# Patient Record
Sex: Female | Born: 1975 | Race: White | Hispanic: No | Marital: Married | Smoking: Current some day smoker
Health system: Southern US, Community
[De-identification: ages and names within clinical notes are randomized; demographics above are authoritative.]

## PROBLEM LIST (undated history)

## (undated) DIAGNOSIS — F909 Attention-deficit hyperactivity disorder, unspecified type: Secondary | ICD-10-CM

## (undated) DIAGNOSIS — Z975 Presence of (intrauterine) contraceptive device: Secondary | ICD-10-CM

## (undated) DIAGNOSIS — K219 Gastro-esophageal reflux disease without esophagitis: Secondary | ICD-10-CM

## (undated) DIAGNOSIS — M48061 Spinal stenosis, lumbar region without neurogenic claudication: Secondary | ICD-10-CM

## (undated) DIAGNOSIS — N809 Endometriosis, unspecified: Secondary | ICD-10-CM

## (undated) HISTORY — PX: OTHER SURGICAL HISTORY: SHX169

## (undated) HISTORY — PX: CHOLECYSTECTOMY: SHX55

## (undated) HISTORY — DX: Spinal stenosis, lumbar region without neurogenic claudication: M48.061

## (undated) HISTORY — DX: Attention-deficit hyperactivity disorder, unspecified type: F90.9

## (undated) HISTORY — DX: Gastro-esophageal reflux disease without esophagitis: K21.9

## (undated) HISTORY — DX: Presence of (intrauterine) contraceptive device: Z97.5

## (undated) HISTORY — PX: WISDOM TOOTH EXTRACTION: SHX21

---
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
## 2003-01-20 ENCOUNTER — Emergency Department (HOSPITAL_COMMUNITY): Admission: EM | Admit: 2003-01-20 | Discharge: 2003-01-20 | Payer: Self-pay | Admitting: Emergency Medicine

## 2005-02-28 LAB — HM COLONOSCOPY

## 2007-07-26 ENCOUNTER — Other Ambulatory Visit: Admission: RE | Admit: 2007-07-26 | Discharge: 2007-07-26 | Payer: Self-pay | Admitting: Gynecology

## 2009-02-12 LAB — LIPID PANEL: Cholesterol: 183 mg/dL (ref 0–200)

## 2010-07-29 ENCOUNTER — Other Ambulatory Visit: Payer: Self-pay | Admitting: Emergency Medicine

## 2010-07-29 ENCOUNTER — Inpatient Hospital Stay (INDEPENDENT_AMBULATORY_CARE_PROVIDER_SITE_OTHER)
Admission: RE | Admit: 2010-07-29 | Discharge: 2010-07-29 | Disposition: A | Discharge status: Home / Self Care | Payer: BC Managed Care – PPO | Source: Ambulatory Visit | Attending: Emergency Medicine | Admitting: Emergency Medicine

## 2010-07-29 ENCOUNTER — Ambulatory Visit
Admission: RE | Admit: 2010-07-29 | Discharge: 2010-07-29 | Disposition: A | Discharge status: Home / Self Care | Payer: BC Managed Care – PPO | Source: Ambulatory Visit | Attending: Emergency Medicine | Admitting: Emergency Medicine

## 2010-07-29 ENCOUNTER — Encounter: Payer: Self-pay | Admitting: Emergency Medicine

## 2010-07-29 DIAGNOSIS — M25569 Pain in unspecified knee: Secondary | ICD-10-CM | POA: Insufficient documentation

## 2010-07-30 ENCOUNTER — Ambulatory Visit (INDEPENDENT_AMBULATORY_CARE_PROVIDER_SITE_OTHER): Payer: BC Managed Care – PPO | Admitting: Family Medicine

## 2010-07-30 ENCOUNTER — Encounter: Payer: Self-pay | Admitting: Family Medicine

## 2010-07-30 VITALS — BP 116/79 | HR 102 | Temp 99.2°F | Ht 65.0 in | Wt 207.0 lb

## 2010-07-30 DIAGNOSIS — M25562 Pain in left knee: Secondary | ICD-10-CM | POA: Insufficient documentation

## 2010-07-30 DIAGNOSIS — M25569 Pain in unspecified knee: Secondary | ICD-10-CM

## 2010-07-30 NOTE — Patient Instructions (Signed)
Your exam is consistent with a lateral meniscal tear of your left knee. We will proceed with an MRI to further assess - if this is present, given how bad your pain is, you'll likely need arthroscopic surgery. Ice 15 minutes at a time 3-4 times a day. Take aleve 2 tabs twice a day with food for pain and inflammation (take your PPI with this). Tylenol ok to take in addition to this. ACE wrap or knee brace to help with support in meantime. Crutches as needed. I will call you with the MRI results and how to proceed.

## 2010-07-30 NOTE — Progress Notes (Signed)
  Subjective:    Patient ID: Rebecca Maxwell, female    DOB: 26-May-1975, 35 y.o.   MRN: [id number]  HPI  PCP: [place]  35 yo F here for left knee injury.  Patient reports on 6/13 she was outside on deck standing, holding door open. Her right foot slipped forward and she twisted/turned her left knee inward and felt a pop lateral left knee. + swelling but no bruising. Swelling has improved since yesterday. Unable to walk since injury - cannot fully bend knee without pain laterally. Feels unstable and weak like it's going to give out. Has been icing but not taking medicines. No true catching or locking but not moving knee well. No prior injuries/surgeries left knee.  History reviewed. No pertinent past medical history.  No current outpatient prescriptions on file prior to visit.    No past surgical history on file.  Allergies  Allergen Reactions  . Penicillins     History   Social History  . Marital Status: Married    Spouse Name: N/A    Number of Children: N/A  . Years of Education: N/A   Occupational History  . Not on file.   Social History Main Topics  . Smoking status: Current Everyday Smoker    Types: Cigarettes  . Smokeless tobacco: Not on file   Comment: 1/2 pack every 3 days  . Alcohol Use: Not on file  . Drug Use: Not on file  . Sexually Active: Not on file   Other Topics Concern  . Not on file   Social History Narrative  . No narrative on file    Family History  Problem Relation Age of Onset  . Diabetes Mother   . Diabetes Sister   . Heart attack Neg Hx   . Hypertension Neg Hx     BP 116/79  Pulse 102  Temp(Src) 99.2 F (37.3 C) (Oral)  Ht 5\' 5"  (1.651 m)  Wt 207 lb (93.895 kg)  BMI 34.45 kg/m2  Review of Systems See HPI above.    Objective:   Physical Exam Gen: NAD  L knee: Mild effusion.  No deformity, bruising, erythema, warmth. TTP lateral joint line.  No posterior patella, medial joint line, patellar tendon, or  other TTP. Full extension but only flexes to 90 degrees - severe pain laterally at this extent of flexion. Stable to valgus and varus stress without pain.  Negative ant/post drawers.  Negative lachmanns Mcmurrays and apleys + laterally.  Negative medially. Negative apprehension NVI distally.  R knee: FROM without pain, swelling, instability.     Assessment & Plan:  1. L knee pain - history and exam consistent with lateral meniscus tear.  + mcmurrays and apleys, limited ROM.  Will proceed with MRI to assess.  Given her limited motion, severe pain, likely she will need arthroscopy if tear confirmed.  Her LCL has a solid end point and no pain with varus stress.  ACL also feels intact on exam.  X-rays reviewed and no fractures or other bony abnormalities.  Will call patient with results and how we will proceed.

## 2010-07-30 NOTE — Assessment & Plan Note (Signed)
history and exam consistent with lateral meniscus tear.  + mcmurrays and apleys, limited ROM.  Will proceed with MRI to assess.  Given her limited motion, severe pain, likely she will need arthroscopy if tear confirmed.  Her LCL has a solid end point and no pain with varus stress.  ACL also feels intact on exam.  X-rays reviewed and no fractures or other bony abnormalities.  Will call patient with results and how we will proceed.

## 2010-07-31 ENCOUNTER — Other Ambulatory Visit: Payer: Self-pay | Admitting: Family Medicine

## 2010-07-31 DIAGNOSIS — M25569 Pain in unspecified knee: Secondary | ICD-10-CM

## 2010-07-31 MED ORDER — OXYCODONE-ACETAMINOPHEN 5-325 MG PO TABS: 1.0000 | ORAL_TABLET | Freq: Four times a day (QID) | ORAL | Status: AC | PRN

## 2010-08-01 ENCOUNTER — Ambulatory Visit (HOSPITAL_BASED_OUTPATIENT_CLINIC_OR_DEPARTMENT_OTHER)
Admission: RE | Admit: 2010-08-01 | Discharge: 2010-08-01 | Disposition: A | Discharge status: Home / Self Care | Payer: BC Managed Care – PPO | Source: Ambulatory Visit | Attending: Family Medicine | Admitting: Family Medicine

## 2010-08-01 ENCOUNTER — Ambulatory Visit (HOSPITAL_BASED_OUTPATIENT_CLINIC_OR_DEPARTMENT_OTHER): Payer: BC Managed Care – PPO

## 2010-08-01 DIAGNOSIS — M7120 Synovial cyst of popliteal space [Baker], unspecified knee: Secondary | ICD-10-CM | POA: Insufficient documentation

## 2010-08-01 DIAGNOSIS — M2240 Chondromalacia patellae, unspecified knee: Secondary | ICD-10-CM | POA: Insufficient documentation

## 2010-08-01 DIAGNOSIS — M25569 Pain in unspecified knee: Secondary | ICD-10-CM | POA: Insufficient documentation

## 2010-08-01 DIAGNOSIS — X58XXXA Exposure to other specified factors, initial encounter: Secondary | ICD-10-CM | POA: Insufficient documentation

## 2010-08-01 DIAGNOSIS — S83419A Sprain of medial collateral ligament of unspecified knee, initial encounter: Secondary | ICD-10-CM

## 2010-08-01 DIAGNOSIS — M25562 Pain in left knee: Secondary | ICD-10-CM

## 2010-08-01 DIAGNOSIS — M25469 Effusion, unspecified knee: Secondary | ICD-10-CM | POA: Insufficient documentation

## 2010-08-01 DIAGNOSIS — S82109A Unspecified fracture of upper end of unspecified tibia, initial encounter for closed fracture: Secondary | ICD-10-CM

## 2010-08-04 ENCOUNTER — Telehealth: Payer: Self-pay | Admitting: Family Medicine

## 2010-08-04 DIAGNOSIS — M25562 Pain in left knee: Secondary | ICD-10-CM

## 2010-08-04 NOTE — Assessment & Plan Note (Signed)
Spoke with patient regarding MRI results - nondisplaced lateral tibial plateau fracture, mild LCL sprain but no other ligament, meniscus tears.  Advised patient to be only touch-down weightbearing for first 4 weeks with crutches OR can use knee scooter.  She reported she was feeling better and was resistant to using crutches or scooter.  Advised her that without doing so, she is at a greater risk of depression and/or displacement of tibial plateau fracture which may require surgery if this happens.  She states she understands these risks.  She will f/u in 2 weeks for repeat knee x-rays with obliques to reassess.

## 2010-08-04 NOTE — Telephone Encounter (Signed)
Spoke with patient regarding MRI results - nondisplaced lateral tibial plateau fracture, mild LCL sprain but no other ligament, meniscus tears.  Advised patient to be only touch-down weightbearing for first 4 weeks with crutches OR can use knee scooter.  She reported she was feeling better and was resistant to using crutches or scooter.  Advised her that without doing so, she is at a greater risk of depression and/or displacement of tibial plateau fracture which may require surgery if this happens.  She states she understands these risks.  She will f/u in 2 weeks for repeat knee x-rays with obliques to reassess. 

## 2010-08-18 ENCOUNTER — Ambulatory Visit (INDEPENDENT_AMBULATORY_CARE_PROVIDER_SITE_OTHER): Payer: BC Managed Care – PPO | Admitting: Family Medicine

## 2010-08-18 VITALS — BP 106/73 | HR 88 | Temp 98.2°F | Ht 65.0 in | Wt 205.0 lb

## 2010-08-18 DIAGNOSIS — M25562 Pain in left knee: Secondary | ICD-10-CM

## 2010-08-18 DIAGNOSIS — M25569 Pain in unspecified knee: Secondary | ICD-10-CM

## 2010-08-20 ENCOUNTER — Encounter: Payer: Self-pay | Admitting: Family Medicine

## 2010-08-20 NOTE — Progress Notes (Signed)
Subjective:    Patient ID: Rebecca Maxwell, female    DOB: 06-Apr-1975, 35 y.o.   MRN: [id number]  Knee Pain     PCP: [place]  35 yo F here for f/u left knee injury.  Initial OV 6/14: Patient reports on 6/13 she was outside on deck standing, holding door open. Her right foot slipped forward and she twisted/turned her left knee inward and felt a pop lateral left knee. + swelling but no bruising. Swelling has improved since yesterday. Unable to walk since injury - cannot fully bend knee without pain laterally. Feels unstable and weak like it's going to give out. Has been icing but not taking medicines. No true catching or locking but not moving knee well. No prior injuries/surgeries left knee.  Today 7/3: Patient was advised to use crutches, avoid sports activities given MRI showed probable hairline lateral tibial plateau fracture and mild LCL sprain. Patient reports pain had completely resolved about 3 days after her MRI. Has not been using crutches but has not gone to gym. Not requiring pain medicine or ice.  History reviewed. No pertinent past medical history.  Current Outpatient Prescriptions on File Prior to Visit  Medication Sig Dispense Refill  . oxyCODONE-acetaminophen (ROXICET) 5-325 MG per tablet Take 1 tablet by mouth every 6 (six) hours as needed for pain.  40 tablet  0  . pantoprazole (PROTONIX) 40 MG tablet         No past surgical history on file.  Allergies  Allergen Reactions  . Penicillins     History   Social History  . Marital Status: Married    Spouse Name: N/A    Number of Children: N/A  . Years of Education: N/A   Occupational History  . Not on file.   Social History Main Topics  . Smoking status: Current Everyday Smoker    Types: Cigarettes  . Smokeless tobacco: Not on file   Comment: 1/2 pack every 3 days  . Alcohol Use: Not on file  . Drug Use: Not on file  . Sexually Active: Not on file   Other Topics Concern  . Not  on file   Social History Narrative  . No narrative on file    Family History  Problem Relation Age of Onset  . Diabetes Mother   . Diabetes Sister   . Heart attack Neg Hx   . Hypertension Neg Hx     There were no vitals taken for this visit.  Review of Systems  See HPI above.    Objective:   Physical Exam  Gen: NAD  L knee: No effusion.  No deformity, bruising, erythema, warmth. No TTP lateral joint line.  No posterior patella, medial joint line, patellar tendon, or other TTP. FROM without pain. Stable to valgus and varus stress without pain.  Negative ant/post drawers.  Negative lachmanns Mcmurrays and apleys negative.  Negative medially. Negative apprehension NVI distally.  R knee: FROM without pain, swelling, instability.     Assessment & Plan:  1. L knee pain - Clinically she is completely improved and has been since approximately 9 days out from her injury - not consistent with a tibial plateau fracture where would expect 4-6 weeks before she is clinically healed - likely overread on MRI and had a contusion.  LCL stable and no pain to testing.  Advised her it is ok to go back to regular activities given her exam today.  Without tenderness, completely normal exam, do not feel we need  to repeat knee radiographs.  Slowly work her way back into gym/sports activities - start at 50% time and effort (zumba), no deep squats and lunges, minimize twisting.  As long as no pain or only 1-2/10 on pain scale, ok to continue.  If worsens, to call me and stop activities.

## 2010-08-20 NOTE — Assessment & Plan Note (Signed)
Clinically she is completely improved and has been since approximately 9 days out from her injury - not consistent with a tibial plateau fracture where would expect 4-6 weeks before she is clinically healed - likely overread on MRI and had a contusion.  LCL stable and no pain to testing.  Advised her it is ok to go back to regular activities given her exam today.  Without tenderness, completely normal exam, do not feel we need to repeat knee radiographs.  Slowly work her way back into gym/sports activities - start at 50% time and effort (zumba), no deep squats and lunges, minimize twisting.  As long as no pain or only 1-2/10 on pain scale, ok to continue.  If worsens, to call me and stop activities.

## 2011-01-18 NOTE — Progress Notes (Signed)
Summary: KNEE PAIN   Vital Signs:  Patient Profile:   35 Years Old Female CC:      left knee pain x today post fall Height:     65 inches Weight:      209 pounds O2 Sat:      96 % O2 treatment:    Room Air Temp:     99.2 degrees F oral Pulse rate:   85 / minute Resp:     16 per minute BP sitting:   112 / 71  (left arm) Cuff size:   regular  Pt. in pain?   yes    Location:   left knee  Vitals Entered By: Lajean Saver RN (July 29, 2010 3:38 PM)                   Updated Prior Medication List: DEPO-PROVERA 150 MG/ML SUSP (MEDROXYPROGESTERONE ACETATE)   Current Allergies: ! PCNHistory of Present Illness History from: patient Chief Complaint: left knee pain x today post fall History of Present Illness: L knee pain since this morning.  She was walking on her deck this morning, slipped and caught herself.  She felt her L knee bend inward and heard a pop on the lateral aspect.  Since then has had pain on the outside of her L knee.  No more popping, locking, giving way.  No swelling.  Not using any meds.  REVIEW OF SYSTEMS Constitutional Symptoms      Denies fever, chills, night sweats, weight loss, weight gain, and fatigue.  Eyes       Denies change in vision, eye pain, eye discharge, glasses, contact lenses, and eye surgery. Ear/Nose/Throat/Mouth       Denies hearing loss/aids, change in hearing, ear pain, ear discharge, dizziness, frequent runny nose, frequent nose bleeds, sinus problems, sore throat, hoarseness, and tooth pain or bleeding.  Respiratory       Denies dry cough, productive cough, wheezing, shortness of breath, asthma, bronchitis, and emphysema/COPD.  Cardiovascular       Denies murmurs, chest pain, and tires easily with exhertion.    Gastrointestinal       Denies stomach pain, nausea/vomiting, diarrhea, constipation, blood in bowel movements, and indigestion. Genitourniary       Denies painful urination, blood or discharge from vagina, kidney stones, and  loss of urinary control. Neurological       Denies paralysis, seizures, and fainting/blackouts. Musculoskeletal       Complains of joint pain.      Denies muscle pain, joint stiffness, decreased range of motion, redness, swelling, muscle weakness, and gout.      Comments: left knee Skin       Denies bruising, unusual mles/lumps or sores, and hair/skin or nail changes.  Psych       Denies mood changes, temper/anger issues, anxiety/stress, speech problems, depression, and sleep problems. Other Comments: Patient fell while on her deck this AM   Past History:  Past Medical History: Unremarkable  Past Surgical History: bilateral ankles-reconstruction  Family History: Family History Diabetes 1st degree relative- mother and sisters  Social History: Nursing Student Married Current Smoker Alcohol use-no Drug use-no Smoking Status:  current Drug Use:  no Physical Exam General appearance: well developed, well nourished, mild distress Skin: no obvious rashes or lesions MSE: oriented to time, place, and person L knee: PROM normal, AROM limited by pain at 45 degrees flexion, no effusion, no ecchymoses, Anterior & posterior drawer normal, McMurrays + for pain, Varus causes mild  pain & valgus stress normal.  Patella freely mobile, Clarks compression test normal.  Good alignment. Distal NV status intact. Assessment New Problems: FAMILY HISTORY DIABETES 1ST DEGREE RELATIVE (ICD-V18.0) KNEE PAIN (ICD-719.46)   Plan New Orders: New Patient Level III [99203] T-DG Knee Complete 4 Views*L* [16109] Planning Comments:   Encourage ice, rest, elevation.  She cannot take NSAIDs so nothing is Rx.  Xray ordered and read as normal.  DDx  includes lateral meniscus, LCL, hamstring tendonopathy.  I would like her to see Dr. 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  Advised her of possible need for brace, PT vs advanced imaging.   The  patient and/or caregiver has been counseled thoroughly with regard to medications prescribed including dosage, schedule, interactions, rationale for use, and possible side effects and they verbalize understanding.  Diagnoses and expected course of recovery discussed and will return if not improved as expected or if the condition worsens. Patient and/or caregiver verbalized understanding.   Orders Added: 1)  New Patient Level III [99203] 2)  T-DG Knee Complete 4 Views*L* [60454]

## 2011-11-29 LAB — HM PAP SMEAR: HM Pap smear: NORMAL

## 2012-03-07 ENCOUNTER — Other Ambulatory Visit: Payer: Self-pay | Admitting: Obstetrics & Gynecology

## 2012-03-08 LAB — TSH: TSH: 1.083 u[IU]/mL (ref 0.350–4.500)

## 2012-03-09 LAB — T3, FREE: T3, Free: 3.2 pg/mL (ref 2.3–4.2)

## 2012-03-09 LAB — T4, FREE: Free T4: 1.41 ng/dL (ref 0.80–1.80)

## 2012-03-13 ENCOUNTER — Telehealth: Payer: Self-pay | Admitting: *Deleted

## 2012-03-13 NOTE — Telephone Encounter (Signed)
Pt notified of normal T3 and T4.

## 2012-03-31 ENCOUNTER — Ambulatory Visit (INDEPENDENT_AMBULATORY_CARE_PROVIDER_SITE_OTHER): Payer: BC Managed Care – PPO | Admitting: Sports Medicine

## 2012-03-31 ENCOUNTER — Encounter: Payer: Self-pay | Admitting: Sports Medicine

## 2012-03-31 VITALS — BP 128/77 | HR 116 | Resp 18 | Ht 64.5 in | Wt 192.0 lb

## 2012-03-31 DIAGNOSIS — F909 Attention-deficit hyperactivity disorder, unspecified type: Secondary | ICD-10-CM

## 2012-03-31 DIAGNOSIS — K219 Gastro-esophageal reflux disease without esophagitis: Secondary | ICD-10-CM

## 2012-03-31 DIAGNOSIS — Z299 Encounter for prophylactic measures, unspecified: Secondary | ICD-10-CM | POA: Insufficient documentation

## 2012-03-31 NOTE — Progress Notes (Signed)
Subjective:    CC: Establish care.   HPI:  Acid reflux: Stable on protonix.  ADHD: Stable on current medication.  Preventative measures: Pap smears and Depo-Provera shots are done by her OB/GYN. Has already had thyroid function checked and we'll get the results of this. She does need some other blood work done.  Past medical history, Surgical history, Family history not pertinant except as noted below, Social history, Allergies, and medications have been entered into the medical record, reviewed, and no changes needed.   Review of Systems: No headache, visual changes, nausea, vomiting, diarrhea, constipation, dizziness, abdominal pain, skin rash, fevers, chills, night sweats, swollen lymph nodes, weight loss, chest pain, body aches, joint swelling, muscle aches, shortness of breath, mood changes, visual or auditory hallucinations.  Objective:    General: Well Developed, well nourished, and in no acute distress.  Neuro: Alert and oriented x3, extra-ocular muscles intact, sensation grossly intact.  HEENT: Normocephalic, atraumatic, pupils equal round reactive to light, neck supple, no masses, no lymphadenopathy, thyroid nonpalpable.  Skin: Warm and dry, no rashes noted.  Cardiac: Regular rate and rhythm, no murmurs rubs or gallops.  Respiratory: Clear to auscultation bilaterally. Not using accessory muscles, speaking in full sentences.  Abdominal: Soft, nontender, nondistended, positive bowel sounds, no masses, no organomegaly.  Musculoskeletal: Shoulder, elbow, wrist, hip, knee, ankle stable, and with full range of motion. Impression and Recommendations:   The patient was counselled, risk factors were discussed, anticipatory guidance given.

## 2012-03-31 NOTE — Assessment & Plan Note (Signed)
Stable, no changes  

## 2012-03-31 NOTE — Assessment & Plan Note (Signed)
Checking some routine blood work. She will get a hold of her thyroid studies. Up-to-date on vaccinations.

## 2012-04-03 ENCOUNTER — Encounter: Payer: Self-pay | Admitting: Sports Medicine

## 2012-04-03 NOTE — Progress Notes (Signed)
Patient ID: Rebecca Maxwell, female   DOB: 11/01/1975, 37 y.o.   MRN: [id number] Records reviewed.

## 2012-04-07 LAB — COMPREHENSIVE METABOLIC PANEL
ALT: 12 U/L (ref 0–35)
AST: 14 U/L (ref 0–37)
Albumin: 4.3 g/dL (ref 3.5–5.2)
Alkaline Phosphatase: 59 U/L (ref 39–117)
BUN: 8 mg/dL (ref 6–23)
CO2: 23 mEq/L (ref 19–32)
Calcium: 9.6 mg/dL (ref 8.4–10.5)
Chloride: 108 mEq/L (ref 96–112)
Creat: 0.69 mg/dL (ref 0.50–1.10)
Glucose, Bld: 84 mg/dL (ref 70–99)
Potassium: 4.2 mEq/L (ref 3.5–5.3)
Sodium: 140 mEq/L (ref 135–145)
Total Bilirubin: 0.4 mg/dL (ref 0.3–1.2)
Total Protein: 7.2 g/dL (ref 6.0–8.3)

## 2012-04-07 LAB — LIPID PANEL
Cholesterol: 164 mg/dL (ref 0–200)
HDL: 50 mg/dL (ref >39)
LDL Cholesterol: 101 mg/dL — ABNORMAL HIGH (ref 0–99)
Total CHOL/HDL Ratio: 3.3 Ratio
Triglycerides: 63 mg/dL (ref <150)
VLDL: 13 mg/dL (ref 0–40)

## 2012-04-08 LAB — HEMOGLOBIN A1C

## 2012-04-08 LAB — VITAMIN D 25 HYDROXY (VIT D DEFICIENCY, FRACTURES): Vit D, 25-Hydroxy: 26 ng/mL — ABNORMAL LOW (ref 30–89)

## 2012-04-09 MED ORDER — VITAMIN D (ERGOCALCIFEROL) 1.25 MG (50000 UNIT) PO CAPS: 50000.0000 [IU] | ORAL_CAPSULE | ORAL | Status: DC

## 2012-04-09 NOTE — Addendum Note (Signed)
Addended by: Monica Becton on: 04/09/2012 06:39 PM   Modules accepted: Orders

## 2012-04-10 ENCOUNTER — Telehealth: Payer: Self-pay | Admitting: *Deleted

## 2012-04-10 MED ORDER — AMPHETAMINE-DEXTROAMPHETAMINE 15 MG PO TABS: 15.0000 mg | ORAL_TABLET | Freq: Two times a day (BID) | ORAL | Status: DC

## 2012-04-10 NOTE — Telephone Encounter (Signed)
Pt has called needing the refill on her Adderal. I wanted to make sure it was ok to fill since you have received her records before I print it out.

## 2012-04-10 NOTE — Telephone Encounter (Signed)
Rx in my outbox

## 2012-04-11 ENCOUNTER — Telehealth: Payer: Self-pay | Admitting: *Deleted

## 2012-04-11 NOTE — Telephone Encounter (Signed)
Eh...ok can cancel it.

## 2012-04-11 NOTE — Telephone Encounter (Signed)
[facility] calls and states that they did not receive a lavender top tube so can not run the A1C order.

## 2012-04-12 ENCOUNTER — Other Ambulatory Visit: Payer: Self-pay

## 2012-04-12 MED ORDER — VITAMIN D (ERGOCALCIFEROL) 1.25 MG (50000 UNIT) PO CAPS: 50000.0000 [IU] | ORAL_CAPSULE | ORAL | Status: DC

## 2012-04-13 ENCOUNTER — Encounter: Payer: Self-pay | Admitting: Sports Medicine

## 2012-04-24 ENCOUNTER — Encounter: Payer: Self-pay | Admitting: Sports Medicine

## 2012-05-02 ENCOUNTER — Ambulatory Visit (INDEPENDENT_AMBULATORY_CARE_PROVIDER_SITE_OTHER): Payer: BC Managed Care – PPO | Admitting: Sports Medicine

## 2012-05-02 DIAGNOSIS — R7309 Other abnormal glucose: Secondary | ICD-10-CM

## 2012-05-02 LAB — POCT GLYCOSYLATED HEMOGLOBIN (HGB A1C): Hemoglobin A1C: 5.3

## 2012-05-02 NOTE — Progress Notes (Signed)
  Subjective:    Patient ID: Rebecca Maxwell, female    DOB: 10-Nov-1975, 37 y.o.   MRN: [id number] A1c is 5.3 today. HPI    Review of Systems     Objective:   Physical Exam        Assessment & Plan:  I was present for all essential parts of this visit and procedure.  Ihor Austin. Benjamin Stain, M.D.

## 2012-05-09 ENCOUNTER — Encounter: Payer: Self-pay | Admitting: Sports Medicine

## 2012-05-09 NOTE — Progress Notes (Signed)
Records reviewed.

## 2012-05-15 ENCOUNTER — Other Ambulatory Visit: Payer: Self-pay | Admitting: *Deleted

## 2012-05-15 MED ORDER — AMPHETAMINE-DEXTROAMPHETAMINE 15 MG PO TABS: 15.0000 mg | ORAL_TABLET | Freq: Two times a day (BID) | ORAL | Status: DC

## 2012-05-30 ENCOUNTER — Other Ambulatory Visit: Payer: Self-pay | Admitting: Obstetrics & Gynecology

## 2012-06-01 LAB — URINE CULTURE
Colony Count: NO GROWTH
Organism ID, Bacteria: NO GROWTH

## 2012-06-01 LAB — URINALYSIS W MICROSCOPIC + REFLEX CULTURE

## 2012-06-12 ENCOUNTER — Other Ambulatory Visit: Payer: Self-pay | Admitting: Sports Medicine

## 2012-06-12 MED ORDER — AMPHETAMINE-DEXTROAMPHETAMINE 15 MG PO TABS: 15.0000 mg | ORAL_TABLET | Freq: Two times a day (BID) | ORAL | Status: DC

## 2012-07-17 ENCOUNTER — Other Ambulatory Visit: Payer: Self-pay | Admitting: *Deleted

## 2012-07-17 MED ORDER — AMPHETAMINE-DEXTROAMPHETAMINE 15 MG PO TABS: 15.0000 mg | ORAL_TABLET | Freq: Two times a day (BID) | ORAL | Status: DC

## 2012-08-25 ENCOUNTER — Encounter: Payer: Self-pay | Admitting: Sports Medicine

## 2012-08-28 ENCOUNTER — Telehealth: Payer: Self-pay

## 2012-08-28 ENCOUNTER — Other Ambulatory Visit: Payer: Self-pay | Admitting: Sports Medicine

## 2012-08-28 DIAGNOSIS — Z01818 Encounter for other preprocedural examination: Secondary | ICD-10-CM

## 2012-08-28 MED ORDER — AMPHETAMINE-DEXTROAMPHETAMINE 15 MG PO TABS: 15.0000 mg | ORAL_TABLET | Freq: Two times a day (BID) | ORAL | Status: DC

## 2012-09-04 ENCOUNTER — Encounter: Payer: Self-pay | Admitting: Sports Medicine

## 2012-09-04 LAB — CBC
HCT: 40.2 % (ref 36.0–46.0)
Hemoglobin: 13.8 g/dL (ref 12.0–15.0)
MCH: 31.6 pg (ref 26.0–34.0)
MCHC: 34.3 g/dL (ref 30.0–36.0)
MCV: 92 fL (ref 78.0–100.0)
Platelets: 288 10*3/uL (ref 150–400)
RBC: 4.37 MIL/uL (ref 3.87–5.11)
RDW: 13.5 % (ref 11.5–15.5)
WBC: 8.3 10*3/uL (ref 4.0–10.5)

## 2012-09-18 DIAGNOSIS — Z720 Tobacco use: Secondary | ICD-10-CM | POA: Insufficient documentation

## 2012-10-03 ENCOUNTER — Other Ambulatory Visit: Payer: Self-pay | Admitting: Sports Medicine

## 2012-10-03 MED ORDER — AMPHETAMINE-DEXTROAMPHETAMINE 15 MG PO TABS: 15.0000 mg | ORAL_TABLET | Freq: Two times a day (BID) | ORAL | Status: DC

## 2012-10-17 ENCOUNTER — Other Ambulatory Visit: Payer: Self-pay | Admitting: Obstetrics & Gynecology

## 2012-10-18 ENCOUNTER — Other Ambulatory Visit: Payer: Self-pay | Admitting: Obstetrics & Gynecology

## 2012-10-18 DIAGNOSIS — N390 Urinary tract infection, site not specified: Secondary | ICD-10-CM

## 2012-10-18 MED ORDER — PHENAZOPYRIDINE HCL 200 MG PO TABS: 200.0000 mg | ORAL_TABLET | Freq: Three times a day (TID) | ORAL | Status: DC | PRN

## 2012-10-18 MED ORDER — CIPROFLOXACIN HCL 500 MG PO TABS: 500.0000 mg | ORAL_TABLET | Freq: Two times a day (BID) | ORAL | Status: DC

## 2012-10-19 ENCOUNTER — Encounter: Payer: Self-pay | Admitting: Sports Medicine

## 2012-10-21 LAB — URINE CULTURE: Colony Count: 25000

## 2012-11-17 ENCOUNTER — Encounter: Payer: Self-pay | Admitting: Sports Medicine

## 2012-11-17 ENCOUNTER — Other Ambulatory Visit: Payer: Self-pay | Admitting: *Deleted

## 2012-11-17 MED ORDER — AMPHETAMINE-DEXTROAMPHETAMINE 15 MG PO TABS: 15.0000 mg | ORAL_TABLET | Freq: Two times a day (BID) | ORAL | Status: DC

## 2012-12-09 ENCOUNTER — Emergency Department (HOSPITAL_BASED_OUTPATIENT_CLINIC_OR_DEPARTMENT_OTHER): Payer: BC Managed Care – PPO

## 2012-12-09 ENCOUNTER — Encounter (HOSPITAL_BASED_OUTPATIENT_CLINIC_OR_DEPARTMENT_OTHER): Payer: Self-pay | Admitting: Emergency Medicine

## 2012-12-09 ENCOUNTER — Emergency Department (HOSPITAL_BASED_OUTPATIENT_CLINIC_OR_DEPARTMENT_OTHER)
Admission: EM | Admit: 2012-12-09 | Discharge: 2012-12-10 | Disposition: A | Discharge status: Home / Self Care | Payer: BC Managed Care – PPO | Attending: Emergency Medicine | Admitting: Emergency Medicine

## 2012-12-09 DIAGNOSIS — Z87891 Personal history of nicotine dependence: Secondary | ICD-10-CM | POA: Insufficient documentation

## 2012-12-09 DIAGNOSIS — Z79899 Other long term (current) drug therapy: Secondary | ICD-10-CM | POA: Insufficient documentation

## 2012-12-09 DIAGNOSIS — T148XXA Other injury of unspecified body region, initial encounter: Secondary | ICD-10-CM

## 2012-12-09 DIAGNOSIS — Z792 Long term (current) use of antibiotics: Secondary | ICD-10-CM | POA: Insufficient documentation

## 2012-12-09 DIAGNOSIS — Z8659 Personal history of other mental and behavioral disorders: Secondary | ICD-10-CM | POA: Insufficient documentation

## 2012-12-09 DIAGNOSIS — Z880 Allergy status to penicillin: Secondary | ICD-10-CM | POA: Insufficient documentation

## 2012-12-09 DIAGNOSIS — R233 Spontaneous ecchymoses: Secondary | ICD-10-CM | POA: Insufficient documentation

## 2012-12-09 DIAGNOSIS — K219 Gastro-esophageal reflux disease without esophagitis: Secondary | ICD-10-CM | POA: Insufficient documentation

## 2012-12-09 HISTORY — DX: Endometriosis, unspecified: N80.9

## 2012-12-09 NOTE — ED Provider Notes (Signed)
CSN: [id number]     Arrival date & time 12/09/12  2140 History  This chart was scribed for Rebecca Linan Smitty Cords, MD by Danella Maiers, ED Scribe. This patient was seen in room MH03/MH03 and the patient's care was started at 11:51 PM.   Chief Complaint  Patient presents with  . Ankle Pain    with deformity   Patient is a 37 y.o. female presenting with ankle pain. The history is provided by the patient. No language interpreter was used.  Ankle Pain Location:  Foot Time since incident:  1 day Injury: no   Foot location:  L foot Pain details:    Quality:  Aching   Radiates to:  Does not radiate   Severity:  Moderate   Onset quality:  Sudden   Timing:  Constant   Progression:  Unchanged Chronicity:  New Dislocation: no   Foreign body present:  No foreign bodies Relieved by:  Nothing Worsened by:  Nothing tried Ineffective treatments:  Ice Associated symptoms: swelling   Associated symptoms: no back pain   Risk factors: no concern for non-accidental trauma    HPI Comments: Rebecca Maxwell is a 37 y.o. female who presents to the Emergency Department complaining of gradual-onset left foot pain with deformity to the dorsal aspect that started 24 hours ago. She denies  falls. Was digging stuff and jamming down on a device.  The pain worsens when the area is touched. She has been trying ibuprofen and ice with no relief.  Past Medical History  Diagnosis Date  . ADHD (attention deficit hyperactivity disorder)   . GERD (gastroesophageal reflux disease)    Past Surgical History  Procedure Laterality Date  . Ankle reconstruction Bilateral 1997  . Cholecystectomy    . Wisdom tooth extraction Bilateral    Family History  Problem Relation Age of Onset  . Diabetes Mother   . Hyperlipidemia Mother   . Hypertension Mother   . Diabetes Sister   . Depression Sister   . Alcohol abuse Father   . Alcohol abuse Maternal Grandmother   . Heart attack Maternal Grandmother   . Stroke  Maternal Grandmother   . Cancer Paternal Grandmother     colon   History  Substance Use Topics  . Smoking status: Former Smoker -- 0.50 packs/day for 10 years    Types: Cigarettes    Quit date: 09/18/2012  . Smokeless tobacco: Never Used     Comment: 1/2 pack every 3 days  . Alcohol Use: No   OB History   Grav Para Term Preterm Abortions TAB SAB Ect Mult Living                 Review of Systems  Musculoskeletal: Positive for arthralgias. Negative for back pain.  All other systems reviewed and are negative.    Allergies  Penicillins  Home Medications   Current Outpatient Rx  Name  Route  Sig  Dispense  Refill  . amphetamine-dextroamphetamine (ADDERALL) 15 MG tablet   Oral   Take 1 tablet (15 mg total) by mouth 2 (two) times daily.   60 tablet   0   . ciprofloxacin (CIPRO) 500 MG tablet   Oral   Take 1 tablet (500 mg total) by mouth 2 (two) times daily.   14 tablet   3   . medroxyPROGESTERone (DEPO-PROVERA) 150 MG/ML injection   Intramuscular   Inject 150 mg into the muscle every 3 (three) months.         Marland Kitchen  pantoprazole (PROTONIX) 40 MG tablet               . phenazopyridine (PYRIDIUM) 200 MG tablet   Oral   Take 1 tablet (200 mg total) by mouth 3 (three) times daily as needed for pain (urethral spasm).   10 tablet   1   . Vitamin D, Ergocalciferol, (DRISDOL) 50000 UNITS CAPS   Oral   Take 1 capsule (50,000 Units total) by mouth every 7 (seven) days.   8 capsule   0    BP 128/77  Pulse 98  Temp(Src) 98.7 F (37.1 C) (Oral)  Resp 18  Ht 5\' 5"  (1.651 m)  Wt 198 lb (89.812 kg)  BMI 32.95 kg/m2  SpO2 98% Physical Exam  Nursing note and vitals reviewed. Constitutional: She is oriented to person, place, and time. She appears well-developed and well-nourished. No distress.  HENT:  Head: Normocephalic and atraumatic.  Mouth/Throat: Oropharynx is clear and moist and mucous membranes are normal.  Eyes: EOM are normal. Pupils are equal, round, and  reactive to light.  Neck: Normal range of motion. Neck supple. No tracheal deviation present.  Cardiovascular: Normal rate and regular rhythm.   Cap refill less than 2 sec.   Pulmonary/Chest: Effort normal and breath sounds normal. No respiratory distress.  Abdominal: Soft. Bowel sounds are normal.  Musculoskeletal: Normal range of motion.  Motor and sensory intact all aspects of the foot.  Hematoma of the dorsum of the foot  Neurological: She is alert and oriented to person, place, and time.  Skin: Skin is warm and dry.  Psychiatric: She has a normal mood and affect. Her behavior is normal.    ED Course  Procedures (including critical care time) Medications - No data to display  DIAGNOSTIC STUDIES: Oxygen Saturation is 98% on RA, normal by my interpretation.    COORDINATION OF CARE: 12:02 AM- Discussed treatment plan with pt. Pt agrees to plan.    Labs Review Labs Reviewed - No data to display Imaging Review Dg Foot Complete Left  12/09/2012   CLINICAL DATA:  Left foot pain. Ankle pain with deformity.  EXAM: LEFT FOOT - COMPLETE 3+ VIEW  COMPARISON:  None.  FINDINGS: Medial malleolar lag screws are present compatible with old ORIF. Calcaneal spurs are present. 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 Deformity of the 5th metatarsal head and neck is present, likely posttraumatic. Soft tissue swelling is present over the dorsum of the midfoot. There is no acute abnormality.  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   Electronically Signed   By: Andreas Newport M.D.   On: 12/09/2012 23:47     EKG Interpretation   None       MDM  No diagnosis found. hematoma    I personally performed the services described in this documentation, which was scribed in my presence. The recorded information has been reviewed and is accurate.     Jasmine Awe, MD 12/10/12 [phone]

## 2012-12-09 NOTE — ED Notes (Signed)
Pt reports gradual onset of pain and deformity to Left foot denies event or injury

## 2012-12-10 MED ORDER — TRAMADOL HCL 50 MG PO TABS
ORAL_TABLET | ORAL | Status: AC
  Administered 2012-12-10: 50 mg via ORAL
  Filled 2012-12-10: qty 1

## 2012-12-10 MED ORDER — TRAMADOL HCL 50 MG PO TABS
50.0000 mg | ORAL_TABLET | Freq: Once | ORAL | Status: AC
  Administered 2012-12-10: 50 mg via ORAL

## 2012-12-10 MED ORDER — MELOXICAM 7.5 MG PO TABS: 7.5000 mg | ORAL_TABLET | Freq: Every day | ORAL | Status: DC

## 2012-12-14 ENCOUNTER — Encounter: Payer: Self-pay | Admitting: *Deleted

## 2012-12-18 ENCOUNTER — Encounter: Payer: Self-pay | Admitting: Sports Medicine

## 2012-12-18 ENCOUNTER — Ambulatory Visit (INDEPENDENT_AMBULATORY_CARE_PROVIDER_SITE_OTHER): Payer: BC Managed Care – PPO | Admitting: Sports Medicine

## 2012-12-18 VITALS — BP 116/71 | HR 90 | Wt 204.0 lb

## 2012-12-18 DIAGNOSIS — M19072 Primary osteoarthritis, left ankle and foot: Secondary | ICD-10-CM

## 2012-12-18 DIAGNOSIS — M19079 Primary osteoarthritis, unspecified ankle and foot: Secondary | ICD-10-CM | POA: Insufficient documentation

## 2012-12-18 MED ORDER — TRAMADOL HCL 50 MG PO TABS: ORAL_TABLET | ORAL | Status: DC
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
## 2012-12-19 ENCOUNTER — Ambulatory Visit: Payer: BC Managed Care – PPO | Admitting: Sports Medicine

## 2012-12-21 ENCOUNTER — Other Ambulatory Visit: Payer: Self-pay

## 2012-12-25 ENCOUNTER — Other Ambulatory Visit: Payer: Self-pay | Admitting: *Deleted

## 2012-12-25 MED ORDER — AMPHETAMINE-DEXTROAMPHETAMINE 15 MG PO TABS: 15.0000 mg | ORAL_TABLET | Freq: Two times a day (BID) | ORAL | Status: DC

## 2012-12-26 ENCOUNTER — Ambulatory Visit (INDEPENDENT_AMBULATORY_CARE_PROVIDER_SITE_OTHER): Payer: BC Managed Care – PPO | Admitting: Nurse Practitioner

## 2012-12-26 ENCOUNTER — Encounter: Payer: Self-pay | Admitting: Nurse Practitioner

## 2012-12-26 VITALS — BP 116/71 | HR 89 | Resp 16 | Ht 65.0 in | Wt 200.0 lb

## 2012-12-26 DIAGNOSIS — G43909 Migraine, unspecified, not intractable, without status migrainosus: Secondary | ICD-10-CM

## 2012-12-26 DIAGNOSIS — G43109 Migraine with aura, not intractable, without status migrainosus: Secondary | ICD-10-CM | POA: Insufficient documentation

## 2012-12-26 MED ORDER — RIZATRIPTAN BENZOATE 10 MG PO TABS: 10.0000 mg | ORAL_TABLET | ORAL | Status: DC | PRN

## 2012-12-26 MED ORDER — ONDANSETRON HCL 8 MG PO TABS: 8.0000 mg | ORAL_TABLET | Freq: Three times a day (TID) | ORAL | Status: DC | PRN

## 2012-12-26 NOTE — Patient Instructions (Signed)
Migraine Headache A migraine headache is an intense, throbbing pain on one or both sides of your head. A migraine can last for 30 minutes to several hours. CAUSES  The exact cause of a migraine headache is not always known. However, a migraine may be caused when nerves in the brain become irritated and release chemicals that cause inflammation. This causes pain. SYMPTOMS  Pain on one or both sides of your head.  Pulsating or throbbing pain.  Severe pain that prevents daily activities.  Pain that is aggravated by any physical activity.  Nausea, vomiting, or both.  Dizziness.  Pain with exposure to bright lights, loud noises, or activity.  General sensitivity to bright lights, loud noises, or smells. Before you get a migraine, you may get warning signs that a migraine is coming (aura). An aura may include:  Seeing flashing lights.  Seeing bright spots, halos, or zig-zag lines.  Having tunnel vision or blurred vision.  Having feelings of numbness or tingling.  Having trouble talking.  Having muscle weakness. MIGRAINE TRIGGERS  Alcohol.  Smoking.  Stress.  Menstruation.  Aged cheeses.  Foods or drinks that contain nitrates, glutamate, aspartame, or tyramine.  Lack of sleep.  Chocolate.  Caffeine.  Hunger.  Physical exertion.  Fatigue.  Medicines used to treat chest pain (nitroglycerine), birth control pills, estrogen, and some blood pressure medicines. DIAGNOSIS  A migraine headache is often diagnosed based on:  Symptoms.  Physical examination.  A CT scan or MRI of your head. TREATMENT Medicines may be given for pain and nausea. Medicines can also be given to help prevent recurrent migraines.  HOME CARE INSTRUCTIONS  Only take over-the-counter or prescription medicines for pain or discomfort as directed by your caregiver. The use of long-term narcotics is not recommended.  Lie down in a dark, quiet room when you have a migraine.  Keep a journal  to find out what may trigger your migraine headaches. For example, write down:  What you eat and drink.  How much sleep you get.  Any change to your diet or medicines.  Limit alcohol consumption.  Quit smoking if you smoke.  Get 7 to 9 hours of sleep, or as recommended by your caregiver.  Limit stress.  Keep lights dim if bright lights bother you and make your migraines worse. SEEK IMMEDIATE MEDICAL CARE IF:   Your migraine becomes severe.  You have a fever.  You have a stiff neck.  You have vision loss.  You have muscular weakness or loss of muscle control.  You start losing your balance or have trouble walking.  You feel faint or pass out.  You have severe symptoms that are different from your first symptoms. MAKE SURE YOU:   Understand these instructions.  Will watch your condition.  Will get help right away if you are not doing well or get worse. Document Released: 02/01/2005 Document Revised: 04/26/2011 Document Reviewed: 01/22/2011 [employer] Patient Information 2014 [employer].  

## 2012-12-26 NOTE — Progress Notes (Signed)
History:  Rebecca Hutchinsonis a 37 y.o. No obstetric history on file. who presents to [facility] with a migraine that started this morning. She has taken Motrin 800 mg at 10:30 am with no relief. She has rare aura that is visual. She has never been seen or treated for migraine. Triggers are stress with work and school.     The following portions of the patient's history were reviewed and updated as appropriate: allergies, current medications, past family history, past medical history, past social history, past surgical history and problem list.  Review of Systems:  Pertinent items are noted in HPI.  Objective:  Physical Exam BP 116/71  Pulse 89  Resp 16  Ht 5\' 5"  (1.651 m)  Wt 200 lb (90.719 kg)  BMI 33.28 kg/m2 GENERAL: Well-developed, well-nourished female in no acute distress.  HEENT: Normocephalic, atraumatic.  NECK: Supple. Normal thyroid.  LUNGS: Normal rate. Clear to auscultation bilaterally.  HEART: Regular rate and rhythm with no adventitious sounds.  EXTREMITIES: No cyanosis, clubbing, or edema, 2+ distal pulses.      Assessment & Plan:  Assessment:  Migraine with Aura  Plans:  Toradol 60 mg IM now Maxalt 10 mg prn Zofran 8 mg prn  Carolynn Serve, NP 12/26/2012 1:10 PM

## 2013-01-25 ENCOUNTER — Other Ambulatory Visit: Payer: BC Managed Care – PPO

## 2013-01-25 ENCOUNTER — Other Ambulatory Visit: Payer: Self-pay | Admitting: Sports Medicine

## 2013-01-25 MED ORDER — AMPHETAMINE-DEXTROAMPHETAMINE 15 MG PO TABS: 15.0000 mg | ORAL_TABLET | Freq: Two times a day (BID) | ORAL | Status: DC

## 2013-01-26 LAB — FOLLICLE STIMULATING HORMONE: FSH: 2.5 m[IU]/mL

## 2013-01-26 LAB — LUTEINIZING HORMONE: LH: 0.9 m[IU]/mL

## 2013-01-26 LAB — ESTRADIOL: Estradiol: 72.3 pg/mL

## 2013-01-31 ENCOUNTER — Other Ambulatory Visit: Payer: Self-pay | Admitting: Family Medicine

## 2013-02-02 ENCOUNTER — Ambulatory Visit (INDEPENDENT_AMBULATORY_CARE_PROVIDER_SITE_OTHER): Payer: BC Managed Care – PPO | Admitting: Family Medicine

## 2013-02-02 ENCOUNTER — Encounter: Payer: BC Managed Care – PPO | Admitting: Family Medicine

## 2013-02-02 ENCOUNTER — Encounter: Payer: Self-pay | Admitting: Family Medicine

## 2013-02-02 VITALS — BP 119/75 | HR 98 | Ht 65.0 in | Wt 210.0 lb

## 2013-02-02 DIAGNOSIS — N803 Endometriosis of pelvic peritoneum, unspecified: Secondary | ICD-10-CM

## 2013-02-02 DIAGNOSIS — Z87891 Personal history of nicotine dependence: Secondary | ICD-10-CM

## 2013-02-02 LAB — URINE CULTURE
Colony Count: NO GROWTH
Organism ID, Bacteria: NO GROWTH

## 2013-02-02 MED ORDER — BUPROPION HCL ER (SR) 150 MG PO TB12: 150.0000 mg | ORAL_TABLET | Freq: Two times a day (BID) | ORAL | Status: DC

## 2013-02-02 NOTE — Patient Instructions (Signed)
Endometriosis Endometriosis is a disease that occurs when the endometrium (lining of the uterus) is misplaced outside of its normal location. It may occur in many locations close to the uterus (womb), but commonly on the ovaries, fallopian tubes, vagina (birth canal) and bowel located close to the uterus. Because the uterus sloughs (expels) its lining every month (menses), there is bleeding whereever the endometrial tissue is located. SYMPTOMS  Often there are no symptoms. However, because blood is irritating to tissues not normally exposed to it, when symptoms occur they vary with the location of the misplaced endometrium. Symptoms often include back and abdominal pain. Periods may be heavier and intercourse may be painful. Infertility may be present. You may have all of these symptoms at one time or another or you may have months with no symptoms at all. Although the symptoms occur mainly during menses, they can occur mid-cycle as well, and usually terminate with menopause. DIAGNOSIS  Your caregiver may recommend a blood test and urine test (urinalysis) to help rule out other conditions. Another common test is ultrasound, a painless procedure that uses sound waves to make a sonogram "picture" of abnormal tissue that could be endometriosis. If your bowel movements are painful around your periods, your caregiver may advise a barium enema (an X-ray of the lower bowel), to try to find the source of your pain. This is sometimes confirmed by laparoscopy. Laparoscopy is a procedure where your caregiver looks into your abdomen with a laparoscope (a small pencil sized telescope). Your caregiver may take a tiny piece of tissue (biopsy) from any abnormal tissue to confirm or document your problem. These tissues are sent to the lab and a pathologist looks at them under the microscope to give a microscopic diagnosis. TREATMENT  Once the diagnosis is made, it can be treated by destruction of the misplaced endometrial  tissue using heat (diathermy), laser, cutting (excision), or chemical means. It may also be treated with hormonal therapy. When using hormonal therapy menses are eliminated, therefore eliminating the monthly exposure to blood by the misplaced endometrial tissue. Only in severe cases is it necessary to perform a hysterectomy with removal of the tubes, uterus and ovaries. HOME CARE INSTRUCTIONS   Only take over-the-counter or prescription medicines for pain, discomfort, or fever as directed by your caregiver.  Avoid activities that produce pain, including a physical sexual relationship.  Do not take aspirin as this may increase bleeding when not on hormonal therapy.  See your caregiver for pain or problems not controlled with treatment. SEEK IMMEDIATE MEDICAL CARE IF:   Your pain is severe and is not responding to pain medicine.  You develop severe nausea and vomiting, or you cannot keep foods down.  Your pain localizes to the right lower part of your abdomen (possible appendicitis).  You have swelling or increasing pain in the abdomen.  You have a fever.  You see blood in your stool. MAKE SURE YOU:   Understand these instructions.  Will watch your condition.  Will get help right away if you are not doing well or get worse. Document Released: 01/30/2000 Document Revised: 04/26/2011 Document Reviewed: 09/29/2012 [facility] Patient Information 2014 [place], [place].

## 2013-02-02 NOTE — Assessment & Plan Note (Signed)
Still with lots of cravings and may desire a trial of Well-butrin to see if this helps with those.

## 2013-02-02 NOTE — Progress Notes (Signed)
Subjective:    Patient ID: Rebecca Maxwell, female    DOB: 1975-12-19, 37 y.o.   MRN: [id number]  Gynecologic Exam    New GYN pt. Today secondary to changing insurance.  She has long h/o infertility with tubal blockage.  Was on Depo for 6 years, last shot in July.  She then underwent BTL and endometrial ablation.  She was found to have Stage 3 endometriosis during that surgery.  Was told she needed hysterectomy.  Last pap was last year and WNL.  Feels like her hormones are coming back after Depo.  No evidence of menopause on recent lab work.  Has f/h early menopause.  She denies pelvic pain ever. She quit smoking earlier this year.  She reports some pulling with her urine and urinary odor, and wonders if she has UTI.  Does not note hematuria.  Past Medical History  Diagnosis Date  . ADHD (attention deficit hyperactivity disorder)   . GERD (gastroesophageal reflux disease)   . Endometriosis STAGE 3   Past Surgical History  Procedure Laterality Date  . Ankle reconstruction Bilateral 1997  . Cholecystectomy    . Wisdom tooth extraction Bilateral   . Uterine ablation     Allergies  Allergen Reactions  . Penicillins Nausea And Vomiting   Current Outpatient Prescriptions on File Prior to Visit  Medication Sig Dispense Refill  . amphetamine-dextroamphetamine (ADDERALL) 15 MG tablet Take 1 tablet (15 mg total) by mouth 2 (two) times daily.  60 tablet  0  . pantoprazole (PROTONIX) 40 MG tablet       . phenazopyridine (PYRIDIUM) 200 MG tablet Take 1 tablet (200 mg total) by mouth 3 (three) times daily as needed for pain (urethral spasm).  10 tablet  1   No current facility-administered medications on file prior to visit.   Family History  Problem Relation Age of Onset  . Diabetes Mother   . Hyperlipidemia Mother   . Hypertension Mother   . Diabetes Sister   . Depression Sister   . Alcohol abuse Father   . Alcohol abuse Maternal Grandmother   . Heart attack Maternal Grandmother     . Stroke Maternal Grandmother   . Cancer Paternal Grandmother     colon   History   Social History  . Marital Status: Married    Spouse Name: N/A    Number of Children: N/A  . Years of Education: N/A   Occupational History  . Not on file.   Social History Main Topics  . Smoking status: Former Smoker -- 0.50 packs/day for 10 years    Types: Cigarettes    Quit date: 05/30/2012  . Smokeless tobacco: Never Used     Comment: 1/2 pack every 3 days  . Alcohol Use: No  . Drug Use: Not on file  . Sexual Activity: Yes   Other Topics Concern  . Not on file   Social History Narrative  . No narrative on file      Review of Systems  All other systems reviewed and are negative.       Objective:   Physical Exam  Vitals reviewed. Constitutional: She is oriented to person, place, and time. She appears well-developed and well-nourished. No distress.  HENT:  Head: Normocephalic and atraumatic.  Eyes: No scleral icterus.  Neck: Neck supple.  Cardiovascular: Normal rate and regular rhythm.   Pulmonary/Chest: Effort normal.  Abdominal: Soft. Bowel sounds are normal. There is no tenderness.  Genitourinary: Vagina normal. Uterus is enlarged (8  wks size). Uterus is not tender. Cervix exhibits no motion tenderness. Right adnexum displays no mass and no tenderness. Left adnexum displays no mass and no tenderness. No vaginal discharge found.  Small amount of firmness, nodularity noted along posterior uterosacral ligaments  Musculoskeletal: Normal range of motion.  Neurological: She is alert and oriented to person, place, and time.  Skin: Skin is warm and dry.  Psychiatric: She has a normal mood and affect. Her behavior is normal.          Assessment & Plan:

## 2013-02-02 NOTE — Assessment & Plan Note (Signed)
Discussed treatment options at length.  Risks/Benefits of Depo Lupron, Definitive therapy with surgery, surgical menopause vs. Leaving ovaries behind, OC's and doing nothing.  Advised of small risk of endometrial CA in endometriosis. Pt. To consider options and let me know her ultimate plan.  She does not have to do anything, but things may progress and she understands this.

## 2013-03-29 ENCOUNTER — Other Ambulatory Visit: Payer: Self-pay | Admitting: Orthopedic Surgery

## 2013-03-29 ENCOUNTER — Other Ambulatory Visit: Payer: Self-pay | Admitting: *Deleted

## 2013-03-29 DIAGNOSIS — M25579 Pain in unspecified ankle and joints of unspecified foot: Secondary | ICD-10-CM

## 2013-03-29 MED ORDER — AMPHETAMINE-DEXTROAMPHETAMINE 15 MG PO TABS: 15.0000 mg | ORAL_TABLET | Freq: Two times a day (BID) | ORAL | Status: DC

## 2013-04-06 ENCOUNTER — Ambulatory Visit (HOSPITAL_BASED_OUTPATIENT_CLINIC_OR_DEPARTMENT_OTHER): Payer: BC Managed Care – PPO

## 2013-04-06 ENCOUNTER — Other Ambulatory Visit: Payer: BC Managed Care – PPO

## 2013-04-06 ENCOUNTER — Other Ambulatory Visit: Payer: Self-pay | Admitting: Orthopedic Surgery

## 2013-04-06 ENCOUNTER — Ambulatory Visit (INDEPENDENT_AMBULATORY_CARE_PROVIDER_SITE_OTHER): Payer: 59

## 2013-04-06 DIAGNOSIS — Z981 Arthrodesis status: Secondary | ICD-10-CM

## 2013-04-06 DIAGNOSIS — M25579 Pain in unspecified ankle and joints of unspecified foot: Secondary | ICD-10-CM

## 2013-04-23 ENCOUNTER — Telehealth: Payer: Self-pay | Admitting: *Deleted

## 2013-04-23 DIAGNOSIS — N920 Excessive and frequent menstruation with regular cycle: Secondary | ICD-10-CM

## 2013-04-23 MED ORDER — MEDROXYPROGESTERONE ACETATE 104 MG/0.65ML ~~LOC~~ SUSP
104.0000 mg | SUBCUTANEOUS | Status: DC
Start: 2013-04-23 — End: 2014-01-08

## 2013-04-23 NOTE — Telephone Encounter (Signed)
Per TO from Dr Kennon Rounds pt may start Depo Provera 104 mg.

## 2013-04-30 ENCOUNTER — Other Ambulatory Visit: Payer: Self-pay | Admitting: *Deleted

## 2013-04-30 MED ORDER — AMPHETAMINE-DEXTROAMPHETAMINE 15 MG PO TABS: 15.0000 mg | ORAL_TABLET | Freq: Two times a day (BID) | ORAL | Status: DC

## 2013-05-25 ENCOUNTER — Telehealth: Payer: Self-pay | Admitting: *Deleted

## 2013-05-28 ENCOUNTER — Other Ambulatory Visit: Payer: Self-pay | Admitting: Sports Medicine

## 2013-05-29 ENCOUNTER — Other Ambulatory Visit: Payer: Self-pay | Admitting: Family Medicine

## 2013-05-29 DIAGNOSIS — F909 Attention-deficit hyperactivity disorder, unspecified type: Secondary | ICD-10-CM

## 2013-05-29 MED ORDER — AMPHETAMINE-DEXTROAMPHETAMINE 15 MG PO TABS: 15.0000 mg | ORAL_TABLET | Freq: Two times a day (BID) | ORAL | Status: DC

## 2013-06-15 ENCOUNTER — Encounter: Payer: Self-pay | Admitting: Sports Medicine

## 2013-06-15 ENCOUNTER — Ambulatory Visit (INDEPENDENT_AMBULATORY_CARE_PROVIDER_SITE_OTHER): Payer: 59 | Admitting: Sports Medicine

## 2013-06-15 VITALS — BP 129/82 | HR 97 | Ht 65.0 in | Wt 212.0 lb

## 2013-06-15 DIAGNOSIS — M19071 Primary osteoarthritis, right ankle and foot: Secondary | ICD-10-CM | POA: Insufficient documentation

## 2013-06-15 DIAGNOSIS — F909 Attention-deficit hyperactivity disorder, unspecified type: Secondary | ICD-10-CM

## 2013-06-15 DIAGNOSIS — M19079 Primary osteoarthritis, unspecified ankle and foot: Secondary | ICD-10-CM

## 2013-06-15 DIAGNOSIS — M19072 Primary osteoarthritis, left ankle and foot: Secondary | ICD-10-CM

## 2013-06-15 MED ORDER — AMPHETAMINE-DEXTROAMPHETAMINE 15 MG PO TABS: 15.0000 mg | ORAL_TABLET | Freq: Two times a day (BID) | ORAL | Status: DC

## 2013-06-15 MED ORDER — MELOXICAM 15 MG PO TABS: ORAL_TABLET | ORAL | Status: DC

## 2013-06-15 NOTE — Assessment & Plan Note (Signed)
Refilling Adderall. 

## 2013-06-15 NOTE — Assessment & Plan Note (Signed)
Ultrasound-guided talocrural joint injection as above. Mobic for pain. Return as needed.

## 2013-06-15 NOTE — Progress Notes (Signed)
  Subjective:    CC: Followup  HPI: ADHD: Needs refill on Adderall.  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 Localized at the temporal joint. Desires injection.  Past medical history, Surgical history, Family history not pertinant except as noted below, Social history, Allergies, and medications have been entered into the medical record, reviewed, and no changes needed.   Review of Systems: No fevers, chills, night sweats, weight loss, chest pain, or shortness of breath.   Objective:    General: Well Developed, well nourished, and in no acute distress.  Neuro: Alert and oriented x3, extra-ocular muscles intact, sensation grossly intact.  HEENT: Normocephalic, atraumatic, pupils equal round reactive to light, neck supple, no masses, no lymphadenopathy, thyroid nonpalpable.  Skin: Warm and dry, no rashes. Cardiac: Regular rate and rhythm, no murmurs rubs or gallops, no lower extremity edema.  Respiratory: Clear to auscultation bilaterally. Not using accessory muscles, speaking in full sentences.  Procedure: Real-time Ultrasound Guided Injection of left talocrural joint Device: GE Logiq E  Verbal informed consent obtained.  Time-out conducted.  Noted no overlying erythema, induration, or other signs of local infection.  Skin prepped in a sterile fashion.  Local anesthesia: Topical Ethyl chloride.  With sterile technique and under real time ultrasound guidance:  25-gauge needle advanced into joints giving over the dorsum of talus. 1 cc Kenalog 40, 3 cc lidocaine injected easily. Completed without difficulty  Pain immediately resolved suggesting accurate placement of the medication.  Advised to call if fevers/chills, erythema, induration, drainage, or persistent bleeding.  Images permanently stored and available for review in the ultrasound unit.  Impression: Technically successful ultrasound guided injection.  Impression and Recommendations:

## 2013-09-17 ENCOUNTER — Ambulatory Visit (INDEPENDENT_AMBULATORY_CARE_PROVIDER_SITE_OTHER): Payer: 59 | Admitting: *Deleted

## 2013-09-17 DIAGNOSIS — Z23 Encounter for immunization: Secondary | ICD-10-CM

## 2013-09-17 MED ORDER — TETANUS-DIPHTH-ACELL PERTUSSIS 5-2.5-18.5 LF-MCG/0.5 IM SUSP
0.5000 mL | Freq: Once | INTRAMUSCULAR | Status: AC
  Administered 2013-09-17: 0.5 mL via INTRAMUSCULAR

## 2013-10-24 ENCOUNTER — Other Ambulatory Visit: Payer: Self-pay | Admitting: Sports Medicine

## 2013-10-24 ENCOUNTER — Telehealth: Payer: Self-pay | Admitting: *Deleted

## 2013-10-24 ENCOUNTER — Encounter: Payer: Self-pay | Admitting: Sports Medicine

## 2013-10-24 DIAGNOSIS — F909 Attention-deficit hyperactivity disorder, unspecified type: Secondary | ICD-10-CM

## 2013-10-24 DIAGNOSIS — K640 First degree hemorrhoids: Secondary | ICD-10-CM

## 2013-10-24 MED ORDER — HYDROCORTISONE ACETATE 25 MG RE SUPP: 25.0000 mg | Freq: Two times a day (BID) | RECTAL | Status: DC

## 2013-10-24 NOTE — Telephone Encounter (Signed)
Rx for [facility] sent to [facility] pharmacy per Dr Kennon Rounds.

## 2013-10-25 MED ORDER — AMPHETAMINE-DEXTROAMPHETAMINE 15 MG PO TABS: 15.0000 mg | ORAL_TABLET | Freq: Two times a day (BID) | ORAL | Status: DC

## 2013-11-08 ENCOUNTER — Encounter: Payer: Self-pay | Admitting: *Deleted

## 2013-11-30 ENCOUNTER — Ambulatory Visit: Payer: 59 | Admitting: Family Medicine

## 2014-01-08 ENCOUNTER — Ambulatory Visit: Payer: 59 | Admitting: *Deleted

## 2014-01-08 ENCOUNTER — Encounter: Payer: Self-pay | Admitting: Family Medicine

## 2014-01-08 ENCOUNTER — Ambulatory Visit (INDEPENDENT_AMBULATORY_CARE_PROVIDER_SITE_OTHER): Payer: 59 | Admitting: Family Medicine

## 2014-01-08 VITALS — BP 124/90 | HR 96 | Ht 65.0 in | Wt 219.2 lb

## 2014-01-08 DIAGNOSIS — Z124 Encounter for screening for malignant neoplasm of cervix: Secondary | ICD-10-CM

## 2014-01-08 DIAGNOSIS — N809 Endometriosis, unspecified: Secondary | ICD-10-CM

## 2014-01-08 DIAGNOSIS — Z1151 Encounter for screening for human papillomavirus (HPV): Secondary | ICD-10-CM

## 2014-01-08 DIAGNOSIS — Z01419 Encounter for gynecological examination (general) (routine) without abnormal findings: Secondary | ICD-10-CM

## 2014-01-08 DIAGNOSIS — N803 Endometriosis of pelvic peritoneum, unspecified: Secondary | ICD-10-CM

## 2014-01-08 LAB — COMPREHENSIVE METABOLIC PANEL
ALK PHOS: 98 U/L (ref 39–117)
ALT: 18 U/L (ref 0–35)
AST: 16 U/L (ref 0–37)
Albumin: 3.9 g/dL (ref 3.5–5.2)
BUN: 7 mg/dL (ref 6–23)
CO2: 25 mEq/L (ref 19–32)
CREATININE: 0.75 mg/dL (ref 0.50–1.10)
Calcium: 8.8 mg/dL (ref 8.4–10.5)
Chloride: 107 mEq/L (ref 96–112)
Glucose, Bld: 75 mg/dL (ref 70–99)
Potassium: 4.6 mEq/L (ref 3.5–5.3)
Sodium: 139 mEq/L (ref 135–145)
Total Bilirubin: 0.4 mg/dL (ref 0.2–1.2)
Total Protein: 6.8 g/dL (ref 6.0–8.3)

## 2014-01-08 LAB — CBC
HCT: 40.6 % (ref 36.0–46.0)
Hemoglobin: 13.2 g/dL (ref 12.0–15.0)
MCH: 30.3 pg (ref 26.0–34.0)
MCHC: 32.5 g/dL (ref 30.0–36.0)
MCV: 93.3 fL (ref 78.0–100.0)
MPV: 10.1 fL (ref 9.4–12.4)
PLATELETS: 327 10*3/uL (ref 150–400)
RBC: 4.35 MIL/uL (ref 3.87–5.11)
RDW: 12.8 % (ref 11.5–15.5)
WBC: 8.1 10*3/uL (ref 4.0–10.5)

## 2014-01-08 LAB — LIPID PANEL
CHOL/HDL RATIO: 3.5 ratio
Cholesterol: 152 mg/dL (ref 0–200)
HDL: 43 mg/dL (ref >39)
LDL CALC: 95 mg/dL (ref 0–99)
Triglycerides: 69 mg/dL (ref <150)
VLDL: 14 mg/dL (ref 0–40)

## 2014-01-08 LAB — TSH: TSH: 0.934 u[IU]/mL (ref 0.350–4.500)

## 2014-01-08 MED ORDER — NORETHINDRONE ACETATE 5 MG PO TABS: 5.0000 mg | ORAL_TABLET | Freq: Every day | ORAL | Status: DC

## 2014-01-08 MED ORDER — LEUPROLIDE ACETATE (3 MONTH) 11.25 MG IM KIT
11.2500 mg | PACK | Freq: Once | INTRAMUSCULAR | Status: AC
  Administered 2014-01-08: 11.25 mg via INTRAMUSCULAR

## 2014-01-08 MED ORDER — LETROZOLE 2.5 MG PO TABS: 2.5000 mg | ORAL_TABLET | Freq: Every day | ORAL | Status: DC

## 2014-01-08 MED ORDER — LEUPROLIDE ACETATE (3 MONTH) 11.25 MG IM KIT: 11.2500 mg | PACK | INTRAMUSCULAR | Status: DC

## 2014-01-08 NOTE — Assessment & Plan Note (Addendum)
Discussed treatment options again.  Will stop DepoProvera, give 3 months of DepoLupron and then start Aygestin and Femara.--consideration of who would be appropriate to do her hysterectomy will be discussed among partners and others to determine best course of therapy.

## 2014-01-08 NOTE — Progress Notes (Signed)
Subjective:     Rebecca Maxwell is a 38 y.o. female and is here for a comprehensive physical exam. The patient reports problems - continued pelvic pain and lower back pain despite depoprovera.  Also, has recent 30# weight gain related to dpeo use.  Has begun smoking agin, infrequently. She has Stage 3 endometriosis on exam and needs definitive surgery, but is moving to a high deductible plan this year and is not interested in surgery.Marland Kitchen  History   Social History  . Marital Status: Married    Spouse Name: N/A    Number of Children: N/A  . Years of Education: N/A   Occupational History  . [employer]   Social History Main Topics  . Smoking status: Former Smoker -- 0.50 packs/day for 10 years    Types: Cigarettes    Quit date: 05/30/2012  . Smokeless tobacco: Never Used     Comment: 1/2 pack every 3 days  . Alcohol Use: No  . Drug Use: Not on file  . Sexual Activity: Yes   Other Topics Concern  . Not on file   Social History Narrative   Health Maintenance  Topic Date Due  . INFLUENZA VACCINE  05/16/2014 (Originally 09/15/2013)  . PAP SMEAR  11/29/2014  . TETANUS/TDAP  09/18/2023    The following portions of the patient's history were reviewed and updated as appropriate: allergies, current medications, past family history, past medical history, past social history, past surgical history and problem list.  Review of Systems A comprehensive review of systems was negative.   Objective:    BP 124/90 mmHg  Pulse 96  Ht 5\' 5"  (1.651 m)  Wt 219 lb 3.2 oz (99.428 kg)  BMI 36.48 kg/m2 General appearance: alert, cooperative and appears stated age Head: Normocephalic, without obvious abnormality, atraumatic Neck: no adenopathy, supple, symmetrical, trachea midline and thyroid not enlarged, symmetric, no tenderness/mass/nodules Lungs: clear to auscultation bilaterally Breasts: normal appearance, no masses or tenderness Heart: regular rate and rhythm, S1, S2 normal, no  murmur, click, rub or gallop Abdomen: soft, non-tender; bowel sounds normal; no masses,  no organomegaly Pelvic: cervix normal in appearance, external genitalia normal, no adnexal masses or tenderness, no cervical motion tenderness, uterus normal size, shape, and consistency, vagina normal without discharge and uterine and adnexa tenderness diffusely Extremities: extremities normal, atraumatic, no cyanosis or edema Pulses: 2+ and symmetric Skin: Skin color, texture, turgor normal. No rashes or lesions Lymph nodes: Cervical, supraclavicular, and axillary nodes normal. Neurologic: Grossly normal    Assessment:    Healthy female exam.      Plan:   Problem List Items Addressed This Visit      Unprioritized   Pelvic peritoneal endometriosis    Discussed treatment options again.  Will stop DepoProvera, give 3 months of DepoLupron and then start Aygestin and Femara.--consideration of who would be appropriate to do her hysterectomy will be discussed among partners and others to determine best course of therapy.    Relevant Medications      AYGESTIN 5 MG PO TABS      FEMARA 2.5 MG PO TABS      leuprolide (LUPRON) 11.25 MG injection    Other Visit Diagnoses    Encounter for routine gynecological examination    -  Primary    Relevant Orders       Cytology - PAP       CBC       Comprehensive metabolic panel       Lipid panel  TSH       Vitamin D 1,25 dihydroxy         See After Visit Summary for Counseling Recommendations

## 2014-01-08 NOTE — Patient Instructions (Signed)
Endometriosis Endometriosis is a condition in which the tissue that lines the uterus (endometrium) grows outside of its normal location. The tissue may grow in many locations close to the uterus, but it commonly grows on the ovaries, fallopian tubes, vagina, or bowel. Because the uterus expels, or sheds, its lining every menstrual cycle, there is bleeding wherever the endometrial tissue is located. This can cause pain because blood is irritating to tissues not normally exposed to it.  CAUSES  The cause of endometriosis is not known.  SIGNS AND SYMPTOMS  Often, there are no symptoms. When symptoms are present, they can vary with the location of the displaced tissue. Various symptoms can occur at different times. Although symptoms occur mainly during a woman's menstrual period, they can also occur midcycle and usually stop with menopause. Some people may go months with no symptoms at all. Symptoms may include:   Back or abdominal pain.   Heavier bleeding during periods.   Pain during intercourse.   Painful bowel movements.   Infertility. DIAGNOSIS  Your health care provider will do a physical exam and ask about your symptoms. Various tests may be done, such as:   Blood tests and urine tests. These are done to help rule out other problems.   Ultrasound. This test is done to look for abnormal tissue.   An X-ray of the lower bowel (barium enema).  Laparoscopy. In this procedure, a thin, lighted tube with a tiny camera on the end (laparoscope) is inserted into your abdomen. This helps your health care provider look for abnormal tissue to confirm the diagnosis. The health care provider may also remove a small piece of tissue (biopsy) from any abnormal tissue found. This tissue sample can then be sent to a lab so it can be looked at under a microscope. TREATMENT  Treatment will vary and may include:   Medicines to relieve pain. Nonsteroidal anti-inflammatory drugs (NSAIDs) are a type of  pain medicine that can help to relieve the pain caused by endometriosis.  Hormonal therapy. When using hormonal therapy, periods are eliminated. This eliminates the monthly exposure to blood by the displaced endometrial tissue.   Surgery. Surgery may sometimes be done to remove the abnormal endometrial tissue. In severe cases, surgery may be done to remove the fallopian tubes, uterus, and ovaries (hysterectomy). HOME CARE INSTRUCTIONS   Take all medicines as directed by your health care provider. Do not take aspirin because it may increase bleeding when you are not on hormonal therapy.   Avoid activities that produce pain, including sexual activity. SEEK MEDICAL CARE IF:  You have pelvic pain before, after, or during your periods.  You have pelvic pain between periods that gets worse during your period.  You have pelvic pain during or after sex.  You have pelvic pain with bowel movements or urination, especially during your period.  You have problems getting pregnant.  You have a fever. SEEK IMMEDIATE MEDICAL CARE IF:   Your pain is severe and is not responding to pain medicine.   You have severe nausea and vomiting, or you cannot keep foods down.   You have pain that is limited to the right lower part of your abdomen.   You have swelling or increasing pain in your abdomen.   You see blood in your stool.  MAKE SURE YOU:   Understand these instructions.  Will watch your condition.  Will get help right away if you are not doing well or get worse. Document Released: 01/30/2000 Document  Revised: 06/18/2013 Document Reviewed: 09/29/2012 [facility] Patient Information 2015 [place], [place]. This information is not intended to replace advice given to you by your health care provider. Make sure you discuss any questions you have with your health care provider. Preventive Care for Adults A healthy lifestyle and preventive care can promote health and wellness. Preventive health  guidelines for women include the following key practices.  A routine yearly physical is a good way to check with your health care provider about your health and preventive screening. It is a chance to share any concerns and updates on your health and to receive a thorough exam.  Visit your dentist for a routine exam and preventive care every 6 months. Brush your teeth twice a day and floss once a day. Good oral hygiene prevents tooth decay and gum disease.  The frequency of eye exams is based on your age, health, family medical history, use of contact lenses, and other factors. Follow your health care provider's recommendations for frequency of eye exams.  Eat a healthy diet. Foods like vegetables, fruits, whole grains, low-fat dairy products, and lean protein foods contain the nutrients you need without too many calories. Decrease your intake of foods high in solid fats, added sugars, and salt. Eat the right amount of calories for you.Get information about a proper diet from your health care provider, if necessary.  Regular physical exercise is one of the most important things you can do for your health. Most adults should get at least 150 minutes of moderate-intensity exercise (any activity that increases your heart rate and causes you to sweat) each week. In addition, most adults need muscle-strengthening exercises on 2 or more days a week.  Maintain a healthy weight. The body mass index (BMI) is a screening tool to identify possible weight problems. It provides an estimate of body fat based on height and weight. Your health care provider can find your BMI and can help you achieve or maintain a healthy weight.For adults 20 years and older:  A BMI below 18.5 is considered underweight.  A BMI of 18.5 to 24.9 is normal.  A BMI of 25 to 29.9 is considered overweight.  A BMI of 30 and above is considered obese.  Maintain normal blood lipids and cholesterol levels by exercising and minimizing  your intake of saturated fat. Eat a balanced diet with plenty of fruit and vegetables. Blood tests for lipids and cholesterol should begin at age 21 and be repeated every 5 years. If your lipid or cholesterol levels are high, you are over 50, or you are at high risk for heart disease, you may need your cholesterol levels checked more frequently.Ongoing high lipid and cholesterol levels should be treated with medicines if diet and exercise are not working.  If you smoke, find out from your health care provider how to quit. If you do not use tobacco, do not start.  Lung cancer screening is recommended for adults aged 11-80 years who are at high risk for developing lung cancer because of a history of smoking. A yearly low-dose CT scan of the lungs is recommended for people who have at least a 30-pack-year history of smoking and are a current smoker or have quit within the past 15 years. A pack year of smoking is smoking an average of 1 pack of cigarettes a day for 1 year (for example: 1 pack a day for 30 years or 2 packs a day for 15 years). Yearly screening should continue until the smoker has  stopped smoking for at least 15 years. Yearly screening should be stopped for people who develop a health problem that would prevent them from having lung cancer treatment.  If you are pregnant, do not drink alcohol. If you are breastfeeding, be very cautious about drinking alcohol. If you are not pregnant and choose to drink alcohol, do not have more than 1 drink per day. One drink is considered to be 12 ounces (355 mL) of beer, 5 ounces (148 mL) of wine, or 1.5 ounces (44 mL) of liquor.  Avoid use of street drugs. Do not share needles with anyone. Ask for help if you need support or instructions about stopping the use of drugs.  High blood pressure causes heart disease and increases the risk of stroke. Your blood pressure should be checked at least every 1 to 2 years. Ongoing high blood pressure should be treated  with medicines if weight loss and exercise do not work.  If you are 43-83 years old, ask your health care provider if you should take aspirin to prevent strokes.  Diabetes screening involves taking a blood sample to check your fasting blood sugar level. This should be done once every 3 years, after age 29, if you are within normal weight and without risk factors for diabetes. Testing should be considered at a younger age or be carried out more frequently if you are overweight and have at least 1 risk factor for diabetes.  Breast cancer screening is essential preventive care for women. You should practice "breast self-awareness." This means understanding the normal appearance and feel of your breasts and may include breast self-examination. Any changes detected, no matter how small, should be reported to a health care provider. Women in their 40s and 30s should have a clinical breast exam (CBE) by a health care provider as part of a regular health exam every 1 to 3 years. After age 107, women should have a CBE every year. Starting at age 38, women should consider having a mammogram (breast X-ray test) every year. Women who have a family history of breast cancer should talk to their health care provider about genetic screening. Women at a high risk of breast cancer should talk to their health care providers about having an MRI and a mammogram every year.  Breast cancer gene (BRCA)-related cancer risk assessment is recommended for women who have family members with BRCA-related cancers. BRCA-related cancers include breast, ovarian, tubal, and peritoneal cancers. Having family members with these cancers may be associated with an increased risk for harmful changes (mutations) in the breast cancer genes BRCA1 and BRCA2. Results of the assessment will determine the need for genetic counseling and BRCA1 and BRCA2 testing.  Routine pelvic exams to screen for cancer are no longer recommended for nonpregnant women who  are considered low risk for cancer of the pelvic organs (ovaries, uterus, and vagina) and who do not have symptoms. Ask your health care provider if a screening pelvic exam is right for you.  If you have had past treatment for cervical cancer or a condition that could lead to cancer, you need Pap tests and screening for cancer for at least 20 years after your treatment. If Pap tests have been discontinued, your risk factors (such as having a new sexual partner) need to be reassessed to determine if screening should be resumed. Some women have medical problems that increase the chance of getting cervical cancer. In these cases, your health care provider may recommend more frequent screening and Pap tests.  The HPV test is an additional test that may be used for cervical cancer screening. The HPV test looks for the virus that can cause the cell changes on the cervix. The cells collected during the Pap test can be tested for HPV. The HPV test could be used to screen women aged 58 years and older, and should be used in women of any age who have unclear Pap test results. After the age of 73, women should have HPV testing at the same frequency as a Pap test.  Colorectal cancer can be detected and often prevented. Most routine colorectal cancer screening begins at the age of 50 years and continues through age 47 years. However, your health care provider may recommend screening at an earlier age if you have risk factors for colon cancer. On a yearly basis, your health care provider may provide home test kits to check for hidden blood in the stool. Use of a small camera at the end of a tube, to directly examine the colon (sigmoidoscopy or colonoscopy), can detect the earliest forms of colorectal cancer. Talk to your health care provider about this at age 32, when routine screening begins. Direct exam of the colon should be repeated every 5-10 years through age 73 years, unless early forms of pre-cancerous polyps or  small growths are found.  People who are at an increased risk for hepatitis B should be screened for this virus. You are considered at high risk for hepatitis B if:  You were born in a country where hepatitis B occurs often. Talk with your health care provider about which countries are considered high risk.  Your parents were born in a high-risk country and you have not received a shot to protect against hepatitis B (hepatitis B vaccine).  You have HIV or AIDS.  You use needles to inject street drugs.  You live with, or have sex with, someone who has hepatitis B.  You get hemodialysis treatment.  You take certain medicines for conditions like cancer, organ transplantation, and autoimmune conditions.  Hepatitis C blood testing is recommended for all people born from [birth year] through 1963 and any individual with known risks for hepatitis C.  Practice safe sex. Use condoms and avoid high-risk sexual practices to reduce the spread of sexually transmitted infections (STIs). STIs include gonorrhea, chlamydia, syphilis, trichomonas, herpes, HPV, and human immunodeficiency virus (HIV). Herpes, HIV, and HPV are viral illnesses that have no cure. They can result in disability, cancer, and death.  You should be screened for sexually transmitted illnesses (STIs) including gonorrhea and chlamydia if:  You are sexually active and are younger than 24 years.  You are older than 24 years and your health care provider tells you that you are at risk for this type of infection.  Your sexual activity has changed since you were last screened and you are at an increased risk for chlamydia or gonorrhea. Ask your health care provider if you are at risk.  If you are at risk of being infected with HIV, it is recommended that you take a prescription medicine daily to prevent HIV infection. This is called preexposure prophylaxis (PrEP). You are considered at risk if:  You are a heterosexual woman, are sexually  active, and are at increased risk for HIV infection.  You take drugs by injection.  You are sexually active with a partner who has HIV.  Talk with your health care provider about whether you are at high risk of being infected with HIV. If you  choose to begin PrEP, you should first be tested for HIV. You should then be tested every 3 months for as long as you are taking PrEP.  Osteoporosis is a disease in which the bones lose minerals and strength with aging. This can result in serious bone fractures or breaks. The risk of osteoporosis can be identified using a bone density scan. Women ages 80 years and over and women at risk for fractures or osteoporosis should discuss screening with their health care providers. Ask your health care provider whether you should take a calcium supplement or vitamin D to reduce the rate of osteoporosis.  Menopause can be associated with physical symptoms and risks. Hormone replacement therapy is available to decrease symptoms and risks. You should talk to your health care provider about whether hormone replacement therapy is right for you.  Use sunscreen. Apply sunscreen liberally and repeatedly throughout the day. You should seek shade when your shadow is shorter than you. Protect yourself by wearing long sleeves, pants, a wide-brimmed hat, and sunglasses year round, whenever you are outdoors.  Once a month, do a whole body skin exam, using a mirror to look at the skin on your back. Tell your health care provider of new moles, moles that have irregular borders, moles that are larger than a pencil eraser, or moles that have changed in shape or color.  Stay current with required vaccines (immunizations).  Influenza vaccine. All adults should be immunized every year.  Tetanus, diphtheria, and acellular pertussis (Td, Tdap) vaccine. Pregnant women should receive 1 dose of Tdap vaccine during each pregnancy. The dose should be obtained regardless of the length of time  since the last dose. Immunization is preferred during the 27th-36th week of gestation. An adult who has not previously received Tdap or who does not know her vaccine status should receive 1 dose of Tdap. This initial dose should be followed by tetanus and diphtheria toxoids (Td) booster doses every 10 years. Adults with an unknown or incomplete history of completing a 3-dose immunization series with Td-containing vaccines should begin or complete a primary immunization series including a Tdap dose. Adults should receive a Td booster every 10 years.  Varicella vaccine. An adult without evidence of immunity to varicella should receive 2 doses or a second dose if she has previously received 1 dose. Pregnant females who do not have evidence of immunity should receive the first dose after pregnancy. This first dose should be obtained before leaving the health care facility. The second dose should be obtained 4-8 weeks after the first dose.  Human papillomavirus (HPV) vaccine. Females aged 13-26 years who have not received the vaccine previously should obtain the 3-dose series. The vaccine is not recommended for use in pregnant females. However, pregnancy testing is not needed before receiving a dose. If a female is found to be pregnant after receiving a dose, no treatment is needed. In that case, the remaining doses should be delayed until after the pregnancy. Immunization is recommended for any person with an immunocompromised condition through the age of 46 years if she did not get any or all doses earlier. During the 3-dose series, the second dose should be obtained 4-8 weeks after the first dose. The third dose should be obtained 24 weeks after the first dose and 16 weeks after the second dose.  Zoster vaccine. One dose is recommended for adults aged 46 years or older unless certain conditions are present.  Measles, mumps, and rubella (MMR) vaccine. Adults born before [birth year]  generally are considered immune to  measles and mumps. Adults born in [birth year] or later should have 1 or more doses of MMR vaccine unless there is a contraindication to the vaccine or there is laboratory evidence of immunity to each of the three diseases. A routine second dose of MMR vaccine should be obtained at least 28 days after the first dose for students attending postsecondary schools, health care workers, or international travelers. People who received inactivated measles vaccine or an unknown type of measles vaccine during 1963-1967 should receive 2 doses of MMR vaccine. People who received inactivated mumps vaccine or an unknown type of mumps vaccine before 1979 and are at high risk for mumps infection should consider immunization with 2 doses of MMR vaccine. For females of childbearing age, rubella immunity should be determined. If there is no evidence of immunity, females who are not pregnant should be vaccinated. If there is no evidence of immunity, females who are pregnant should delay immunization until after pregnancy. Unvaccinated health care workers born before [birth year] who lack laboratory evidence of measles, mumps, or rubella immunity or laboratory confirmation of disease should consider measles and mumps immunization with 2 doses of MMR vaccine or rubella immunization with 1 dose of MMR vaccine.  Pneumococcal 13-valent conjugate (PCV13) vaccine. When indicated, a person who is uncertain of her immunization history and has no record of immunization should receive the PCV13 vaccine. An adult aged 68 years or older who has certain medical conditions and has not been previously immunized should receive 1 dose of PCV13 vaccine. This PCV13 should be followed with a dose of pneumococcal polysaccharide (PPSV23) vaccine. The PPSV23 vaccine dose should be obtained at least 8 weeks after the dose of PCV13 vaccine. An adult aged 11 years or older who has certain medical conditions and previously received 1 or more doses of PPSV23 vaccine should  receive 1 dose of PCV13. The PCV13 vaccine dose should be obtained 1 or more years after the last PPSV23 vaccine dose.  Pneumococcal polysaccharide (PPSV23) vaccine. When PCV13 is also indicated, PCV13 should be obtained first. All adults aged 54 years and older should be immunized. An adult younger than age 4 years who has certain medical conditions should be immunized. Any person who resides in a nursing home or long-term care facility should be immunized. An adult smoker should be immunized. People with an immunocompromised condition and certain other conditions should receive both PCV13 and PPSV23 vaccines. People with human immunodeficiency virus (HIV) infection should be immunized as soon as possible after diagnosis. Immunization during chemotherapy or radiation therapy should be avoided. Routine use of PPSV23 vaccine is not recommended for American Indians, [place]atives, or people younger than 65 years unless there are medical conditions that require PPSV23 vaccine. When indicated, people who have unknown immunization and have no record of immunization should receive PPSV23 vaccine. One-time revaccination 5 years after the first dose of PPSV23 is recommended for people aged 19-64 years who have chronic kidney failure, nephrotic syndrome, asplenia, or immunocompromised conditions. People who received 1-2 doses of PPSV23 before age 84 years should receive another dose of PPSV23 vaccine at age 91 years or later if at least 5 years have passed since the previous dose. Doses of PPSV23 are not needed for people immunized with PPSV23 at or after age 63 years.  Meningococcal vaccine. Adults with asplenia or persistent complement component deficiencies should receive 2 doses of quadrivalent meningococcal conjugate (MenACWY-D) vaccine. The doses should be obtained at least 2 months apart.  Microbiologists working with certain meningococcal bacteria, military recruits, people at risk during an outbreak, and people  who travel to or live in countries with a high rate of meningitis should be immunized. A first-year college student up through age 12 years who is living in a residence hall should receive a dose if she did not receive a dose on or after her 16th birthday. Adults who have certain high-risk conditions should receive one or more doses of vaccine.  Hepatitis A vaccine. Adults who wish to be protected from this disease, have certain high-risk conditions, work with hepatitis A-infected animals, work in hepatitis A research labs, or travel to or work in countries with a high rate of hepatitis A should be immunized. Adults who were previously unvaccinated and who anticipate close contact with an international adoptee during the first 60 days after arrival in the [place] from a country with a high rate of hepatitis A should be immunized.  Hepatitis B vaccine. Adults who wish to be protected from this disease, have certain high-risk conditions, may be exposed to blood or other infectious body fluids, are household contacts or sex partners of hepatitis B positive people, are clients or workers in certain care facilities, or travel to or work in countries with a high rate of hepatitis B should be immunized.  Haemophilus influenzae type b (Hib) vaccine. A previously unvaccinated person with asplenia or sickle cell disease or having a scheduled splenectomy should receive 1 dose of Hib vaccine. Regardless of previous immunization, a recipient of a hematopoietic stem cell transplant should receive a 3-dose series 6-12 months after her successful transplant. Hib vaccine is not recommended for adults with HIV infection. Preventive Services / Frequency Ages 23 to 39 years  Blood pressure check.** / Every 1 to 2 years.  Lipid and cholesterol check.** / Every 5 years beginning at age 58.  Clinical breast exam.** / Every 3 years for women in their 89s and 30s.  BRCA-related cancer risk assessment.** / For women who  have family members with a BRCA-related cancer (breast, ovarian, tubal, or peritoneal cancers).  Pap test.** / Every 2 years from ages 30 through 23. Every 3 years starting at age 72 through age 69 or 97 with a history of 3 consecutive normal Pap tests.  HPV screening.** / Every 3 years from ages 20 through ages 46 to 45 with a history of 3 consecutive normal Pap tests.  Hepatitis C blood test.** / For any individual with known risks for hepatitis C.  Skin self-exam. / Monthly.  Influenza vaccine. / Every year.  Tetanus, diphtheria, and acellular pertussis (Tdap, Td) vaccine.** / Consult your health care provider. Pregnant women should receive 1 dose of Tdap vaccine during each pregnancy. 1 dose of Td every 10 years.  Varicella vaccine.** / Consult your health care provider. Pregnant females who do not have evidence of immunity should receive the first dose after pregnancy.  HPV vaccine. / 3 doses over 6 months, if 26 and younger. The vaccine is not recommended for use in pregnant females. However, pregnancy testing is not needed before receiving a dose.  Measles, mumps, rubella (MMR) vaccine.** / You need at least 1 dose of MMR if you were born in 1957 or later. You may also need a 2nd dose. For females of childbearing age, rubella immunity should be determined. If there is no evidence of immunity, females who are not pregnant should be vaccinated. If there is no evidence of immunity, females who are pregnant should  delay immunization until after pregnancy.  Pneumococcal 13-valent conjugate (PCV13) vaccine.** / Consult your health care provider.  Pneumococcal polysaccharide (PPSV23) vaccine.** / 1 to 2 doses if you smoke cigarettes or if you have certain conditions.  Meningococcal vaccine.** / 1 dose if you are age 2 to 93 years and a Market researcher living in a residence hall, or have one of several medical conditions, you need to get vaccinated against meningococcal disease.  You may also need additional booster doses.  Hepatitis A vaccine.** / Consult your health care provider.  Hepatitis B vaccine.** / Consult your health care provider.  Haemophilus influenzae type b (Hib) vaccine.** / Consult your health care provider. Ages 59 to 44 years  Blood pressure check.** / Every 1 to 2 years.  Lipid and cholesterol check.** / Every 5 years beginning at age 91 years.  Lung cancer screening. / Every year if you are aged 64-80 years and have a 30-pack-year history of smoking and currently smoke or have quit within the past 15 years. Yearly screening is stopped once you have quit smoking for at least 15 years or develop a health problem that would prevent you from having lung cancer treatment.  Clinical breast exam.** / Every year after age 8 years.  BRCA-related cancer risk assessment.** / For women who have family members with a BRCA-related cancer (breast, ovarian, tubal, or peritoneal cancers).  Mammogram.** / Every year beginning at age 38 years and continuing for as long as you are in good health. Consult with your health care provider.  Pap test.** / Every 3 years starting at age 21 years through age 62 or 42 years with a history of 3 consecutive normal Pap tests.  HPV screening.** / Every 3 years from ages 55 years through ages 47 to 55 years with a history of 3 consecutive normal Pap tests.  Fecal occult blood test (FOBT) of stool. / Every year beginning at age 5 years and continuing until age 31 years. You may not need to do this test if you get a colonoscopy every 10 years.  Flexible sigmoidoscopy or colonoscopy.** / Every 5 years for a flexible sigmoidoscopy or every 10 years for a colonoscopy beginning at age 43 years and continuing until age 69 years.  Hepatitis C blood test.** / For all people born from [birth year] through 1965 and any individual with known risks for hepatitis C.  Skin self-exam. / Monthly.  Influenza vaccine. / Every year.  Tetanus,  diphtheria, and acellular pertussis (Tdap/Td) vaccine.** / Consult your health care provider. Pregnant women should receive 1 dose of Tdap vaccine during each pregnancy. 1 dose of Td every 10 years.  Varicella vaccine.** / Consult your health care provider. Pregnant females who do not have evidence of immunity should receive the first dose after pregnancy.  Zoster vaccine.** / 1 dose for adults aged 18 years or older.  Measles, mumps, rubella (MMR) vaccine.** / You need at least 1 dose of MMR if you were born in 1957 or later. You may also need a 2nd dose. For females of childbearing age, rubella immunity should be determined. If there is no evidence of immunity, females who are not pregnant should be vaccinated. If there is no evidence of immunity, females who are pregnant should delay immunization until after pregnancy.  Pneumococcal 13-valent conjugate (PCV13) vaccine.** / Consult your health care provider.  Pneumococcal polysaccharide (PPSV23) vaccine.** / 1 to 2 doses if you smoke cigarettes or if you have certain conditions.  Meningococcal vaccine.** /  Consult your health care provider.  Hepatitis A vaccine.** / Consult your health care provider.  Hepatitis B vaccine.** / Consult your health care provider.  Haemophilus influenzae type b (Hib) vaccine.** / Consult your health care provider. Ages 15 years and over  Blood pressure check.** / Every 1 to 2 years.  Lipid and cholesterol check.** / Every 5 years beginning at age 91 years.  Lung cancer screening. / Every year if you are aged 30-80 years and have a 30-pack-year history of smoking and currently smoke or have quit within the past 15 years. Yearly screening is stopped once you have quit smoking for at least 15 years or develop a health problem that would prevent you from having lung cancer treatment.  Clinical breast exam.** / Every year after age 41 years.  BRCA-related cancer risk assessment.** / For women who have family  members with a BRCA-related cancer (breast, ovarian, tubal, or peritoneal cancers).  Mammogram.** / Every year beginning at age 86 years and continuing for as long as you are in good health. Consult with your health care provider.  Pap test.** / Every 3 years starting at age 14 years through age 67 or 7 years with 3 consecutive normal Pap tests. Testing can be stopped between 65 and 70 years with 3 consecutive normal Pap tests and no abnormal Pap or HPV tests in the past 10 years.  HPV screening.** / Every 3 years from ages 30 years through ages 34 or 59 years with a history of 3 consecutive normal Pap tests. Testing can be stopped between 65 and 70 years with 3 consecutive normal Pap tests and no abnormal Pap or HPV tests in the past 10 years.  Fecal occult blood test (FOBT) of stool. / Every year beginning at age 3 years and continuing until age 16 years. You may not need to do this test if you get a colonoscopy every 10 years.  Flexible sigmoidoscopy or colonoscopy.** / Every 5 years for a flexible sigmoidoscopy or every 10 years for a colonoscopy beginning at age 48 years and continuing until age 62 years.  Hepatitis C blood test.** / For all people born from [birth year] through 1963 and any individual with known risks for hepatitis C.  Osteoporosis screening.** / A one-time screening for women ages 42 years and over and women at risk for fractures or osteoporosis.  Skin self-exam. / Monthly.  Influenza vaccine. / Every year.  Tetanus, diphtheria, and acellular pertussis (Tdap/Td) vaccine.** / 1 dose of Td every 10 years.  Varicella vaccine.** / Consult your health care provider.  Zoster vaccine.** / 1 dose for adults aged 70 years or older.  Pneumococcal 13-valent conjugate (PCV13) vaccine.** / Consult your health care provider.  Pneumococcal polysaccharide (PPSV23) vaccine.** / 1 dose for all adults aged 31 years and older.  Meningococcal vaccine.** / Consult your health care  provider.  Hepatitis A vaccine.** / Consult your health care provider.  Hepatitis B vaccine.** / Consult your health care provider.  Haemophilus influenzae type b (Hib) vaccine.** / Consult your health care provider. ** Family history and personal history of risk and conditions may change your health care provider's recommendations. Document Released: 03/30/2001 Document Revised: 06/18/2013 Document Reviewed: 06/29/2010 [facility] Patient Information 2015 [place], [place]. This information is not intended to replace advice given to you by your health care provider. Make sure you discuss any questions you have with your health care provider.

## 2014-01-11 LAB — VITAMIN D 1,25 DIHYDROXY
Vitamin D 1, 25 (OH)2 Total: 40 pg/mL (ref 18–72)
Vitamin D2 1, 25 (OH)2: <8 pg/mL
Vitamin D3 1, 25 (OH)2: 40 pg/mL

## 2014-01-14 ENCOUNTER — Telehealth: Payer: Self-pay | Admitting: *Deleted

## 2014-01-14 LAB — CYTOLOGY - PAP

## 2014-01-14 NOTE — Telephone Encounter (Signed)
Notified patient of lab results 

## 2014-01-17 ENCOUNTER — Encounter: Payer: Self-pay | Admitting: Family Medicine

## 2014-01-17 ENCOUNTER — Ambulatory Visit (INDEPENDENT_AMBULATORY_CARE_PROVIDER_SITE_OTHER): Payer: 59 | Admitting: Family Medicine

## 2014-01-17 VITALS — BP 123/78 | HR 98 | Ht 65.0 in | Wt 222.0 lb

## 2014-01-17 DIAGNOSIS — M62838 Other muscle spasm: Secondary | ICD-10-CM

## 2014-01-17 DIAGNOSIS — M6248 Contracture of muscle, other site: Secondary | ICD-10-CM

## 2014-01-17 MED ORDER — DICLOFENAC SODIUM 50 MG PO TBEC: DELAYED_RELEASE_TABLET | ORAL | Status: DC

## 2014-01-17 MED ORDER — ORPHENADRINE CITRATE ER 100 MG PO TB12: 100.0000 mg | ORAL_TABLET | Freq: Two times a day (BID) | ORAL | Status: DC | PRN

## 2014-01-17 NOTE — Progress Notes (Signed)
CC: Rebecca Maxwell is a 38 y.o. female is here for Neck Pain   Subjective: HPI:  Complaints of posterior shoulder pain that radiates from the back of the shoulders across the back. It is described as in the upper back and has been present for the past week. Symptoms began the day after she was twisting her body in abnormal positions while putting up Christmas decorations. Symptoms are slightly worsening over the past 2-3 days. They described as a tightness and soreness. Slight improvement from ibuprofen but only for a few hours and only minimal improvement.  Worse with turning the neck to the left or right nothing else seems to make symptoms better or worse. She's tried ice and heat neither of which are helpful. Symptoms are bad enough to where now they're significantly interfering with sleep. She denies midline neck pain, nor radiation of pain into the arms. Denies motor or sensory disturbances other than pain. She denies any injury or trauma. Denies chest pain or shortness of breath   Review Of Systems Outlined In HPI  Past Medical History  Diagnosis Date  . ADHD (attention deficit hyperactivity disorder)   . GERD (gastroesophageal reflux disease)   . Endometriosis STAGE 3    Past Surgical History  Procedure Laterality Date  . Ankle reconstruction Bilateral 1997  . Cholecystectomy    . Wisdom tooth extraction Bilateral   . Uterine ablation     Family History  Problem Relation Age of Onset  . Diabetes Mother   . Hyperlipidemia Mother   . Hypertension Mother   . Diabetes Sister   . Depression Sister   . Alcohol abuse Father   . Alcohol abuse Maternal Grandmother   . Heart attack Maternal Grandmother   . Stroke Maternal Grandmother   . Cancer Paternal Grandmother     colon    History   Social History  . Marital Status: Married    Spouse Name: N/A    Number of Children: N/A  . Years of Education: N/A   Occupational History  . [employer]   Social History Main  Topics  . Smoking status: Current Some Day Smoker -- 0.50 packs/day for 10 years    Types: Cigarettes    Last Attempt to Quit: 05/30/2012  . Smokeless tobacco: Never Used     Comment: 1/2 pack every 3 days  . Alcohol Use: No  . Drug Use: Not on file  . Sexual Activity: Yes   Other Topics Concern  . Not on file   Social History Narrative     Objective: BP 123/78 mmHg  Pulse 98  Ht 5\' 5"  (1.651 m)  Wt 222 lb (100.699 kg)  BMI 36.94 kg/m2  General: Alert and Oriented, No Acute Distress HEENT: Pupils equal, round, reactive to light. Conjunctivae clear.  Moist mucous membranes parents unremarkable Lungs: Clear comfortable work of breathing Cardiac: Regular rate and rhythm.  Neck: No midline spinous process tenderness to palpation of the cervical spine. Left and right rotation limited to 45 from the midline. No flexion or extension restrictions. Upper trapezius muscle is significantly hypertonic bilaterally but not particularly tender to palpation. No SCM hypertonicity. Neuro: Cranial nerves II-12 grossly intact with full strength of both upper extremities C5 and C6 DTR 2 over 4 bilaterally Extremities: No peripheral edema.  Strong peripheral pulses.  Mental Status: No depression, anxiety, nor agitation. Skin: Warm and dry.  Assessment & Plan: Rebecca Maxwell was seen today for neck pain.  Diagnoses and associated orders for this  visit:  Neck muscle spasm - diclofenac (VOLTAREN) 50 MG EC tablet; Take one tablet every 8 hours only as needed for pain, take with small snack. - orphenadrine (NORFLEX) 100 MG tablet; Take 1 tablet (100 mg total) by mouth 2 (two) times daily as needed for muscle spasms.    Neck spasm due to muscular strain: Start diclofenac scheduled every 8 hours and Norflex. If symptoms are still interfere with sleep please contact me tomorrow and I'll provide her with daily at bedtime low-dose Valium. Begin home rehabilitative stretching and exercises that was provided to  her and a handout.Signs and symptoms requring emergent/urgent reevaluation were discussed with the patient.   Return if symptoms worsen or fail to improve.

## 2014-02-11 ENCOUNTER — Other Ambulatory Visit: Payer: Self-pay | Admitting: Sports Medicine

## 2014-02-11 ENCOUNTER — Encounter: Payer: Self-pay | Admitting: Sports Medicine

## 2014-02-11 DIAGNOSIS — F909 Attention-deficit hyperactivity disorder, unspecified type: Secondary | ICD-10-CM

## 2014-02-11 MED ORDER — AMPHETAMINE-DEXTROAMPHETAMINE 15 MG PO TABS: 15.0000 mg | ORAL_TABLET | Freq: Two times a day (BID) | ORAL | Status: DC

## 2014-04-05 ENCOUNTER — Encounter: Payer: Self-pay | Admitting: Sports Medicine

## 2014-04-05 ENCOUNTER — Ambulatory Visit (INDEPENDENT_AMBULATORY_CARE_PROVIDER_SITE_OTHER): Payer: 59 | Admitting: Sports Medicine

## 2014-04-05 VITALS — BP 107/76 | HR 109 | Ht 64.5 in | Wt 224.0 lb

## 2014-04-05 DIAGNOSIS — F909 Attention-deficit hyperactivity disorder, unspecified type: Secondary | ICD-10-CM

## 2014-04-05 MED ORDER — AMPHETAMINE-DEXTROAMPHETAMINE 20 MG PO TABS: 20.0000 mg | ORAL_TABLET | Freq: Two times a day (BID) | ORAL | Status: DC

## 2014-04-05 NOTE — Assessment & Plan Note (Signed)
Doing well on 15 mg of Adderall, switching to 20 mg twice a day.

## 2014-04-05 NOTE — Progress Notes (Signed)
  Subjective:    CC: Follow-up  HPI: Adult ADHD: Insufficient response to 15 mg of Adderall twice a day, desires to go up on the dose, no palpitations, chest pain, headaches, visual changes.  Past medical history, Surgical history, Family history not pertinant except as noted below, Social history, Allergies, and medications have been entered into the medical record, reviewed, and no changes needed.   Review of Systems: No fevers, chills, night sweats, weight loss, chest pain, or shortness of breath.   Objective:    General: Well Developed, well nourished, and in no acute distress.  Neuro: Alert and oriented x3, extra-ocular muscles intact, sensation grossly intact.  HEENT: Normocephalic, atraumatic, pupils equal round reactive to light, neck supple, no masses, no lymphadenopathy, thyroid nonpalpable.  Skin: Warm and dry, no rashes. Cardiac: Regular rate and rhythm, no murmurs rubs or gallops, no lower extremity edema.  Respiratory: Clear to auscultation bilaterally. Not using accessory muscles, speaking in full sentences.  Impression and Recommendations:

## 2014-04-16 ENCOUNTER — Ambulatory Visit (INDEPENDENT_AMBULATORY_CARE_PROVIDER_SITE_OTHER): Payer: 59 | Admitting: Family Medicine

## 2014-04-16 ENCOUNTER — Encounter: Payer: Self-pay | Admitting: Family Medicine

## 2014-04-16 VITALS — BP 134/84 | HR 89 | Ht 65.0 in

## 2014-04-16 DIAGNOSIS — S8992XA Unspecified injury of left lower leg, initial encounter: Secondary | ICD-10-CM

## 2014-04-16 DIAGNOSIS — M6248 Contracture of muscle, other site: Secondary | ICD-10-CM

## 2014-04-16 DIAGNOSIS — M62838 Other muscle spasm: Secondary | ICD-10-CM

## 2014-04-16 MED ORDER — DICLOFENAC SODIUM 50 MG PO TBEC: 50.0000 mg | DELAYED_RELEASE_TABLET | Freq: Two times a day (BID) | ORAL | Status: DC

## 2014-04-16 NOTE — Patient Instructions (Signed)
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 Wear knee sleeve or brace when up and walking around for next 6 weeks. Icing 15 minutes at a time 3-4 times a day as needed. Diclofenac twice a day with food for pain and inflammation. Home exercises 3 sets of 10 once a day for next 6 weeks - straight leg raises, straight leg raises with foot turned outwards, and side leg raises. Follow up with me in 1 month for reevaluation. If not improving would consider imaging, physical therapy.

## 2014-04-17 DIAGNOSIS — S8992XA Unspecified injury of left lower leg, initial encounter: Secondary | ICD-10-CM | POA: Insufficient documentation

## 2014-04-17 NOTE — Assessment & Plan Note (Signed)
history and exam consistent with patellar subluxation.  Advised to take nsaid (diclofenac), ice knee, use knee sleeve or brace for support.  Start home exercise program which was demonstrated today.  F/u in 1 month for reevaluation.  Consider physical therapy, imaging if not improving.

## 2014-04-17 NOTE — Progress Notes (Signed)
PCP: Aundria Mems, MD  Subjective:   HPI: Patient is a 39 y.o. female here for left knee pain.  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 She was braced with left leg forward, patient slowly lowered over left leg and Rebecca Maxwell like left knee was pushed downward as the patient was stuck on top of her left knee. + swelling. Taking ibuprofen and icing. Then on Sunday was coming down the steps when she Maxwell a tear sensation in left knee. Pain is around the patella mainly laterally. No catching or locking.  Past Medical History  Diagnosis Date  . ADHD (attention deficit hyperactivity disorder)   . GERD (gastroesophageal reflux disease)   . Endometriosis STAGE 3    Current Outpatient Prescriptions on File Prior to Visit  Medication Sig Dispense Refill  . amphetamine-dextroamphetamine (ADDERALL) 20 MG tablet Take 1 tablet (20 mg total) by mouth 2 (two) times daily. 180 tablet 0  . hydrocortisone (ANUSOL-HC) 25 MG suppository Place 1 suppository (25 mg total) rectally 2 (two) times daily. (Patient taking differently: Place 25 mg rectally as needed. ) 12 suppository 1  . letrozole (FEMARA) 2.5 MG tablet Take 1 tablet (2.5 mg total) by mouth daily. 30 tablet 11  . leuprolide (LUPRON) 11.25 MG injection Inject 11.25 mg into the muscle every 3 (three) months. 1 each 0  . norethindrone (AYGESTIN) 5 MG tablet Take 1 tablet (5 mg total) by mouth daily. 30 tablet 11  . orphenadrine (NORFLEX) 100 MG tablet Take 1 tablet (100 mg total) by mouth 2 (two) times daily as needed for muscle spasms. 40 tablet 0   No current facility-administered medications on file prior to visit.    Past Surgical History  Procedure Laterality Date  . Ankle reconstruction Bilateral 1997  . Cholecystectomy    . Wisdom tooth extraction Bilateral   . Uterine ablation      Allergies  Allergen Reactions  . Other Other (See  Comments)    Oral Pain Medications  . Penicillins Nausea And Vomiting  . Tramadol-Acetaminophen Anxiety    History   Social History  . Marital Status: Married    Spouse Name: N/A  . Number of Children: N/A  . Years of Education: N/A   Occupational History  . [employer]   Social History Main Topics  . Smoking status: Current Some Day Smoker -- 0.50 packs/day for 10 years    Types: Cigarettes    Last Attempt to Quit: 05/30/2012  . Smokeless tobacco: Never Used     Comment: 1/2 pack every 3 days  . Alcohol Use: No  . Drug Use: Not on file  . Sexual Activity: Yes   Other Topics Concern  . Not on file   Social History Narrative    Family History  Problem Relation Age of Onset  . Diabetes Mother   . Hyperlipidemia Mother   . Hypertension Mother   . Diabetes Sister   . Depression Sister   . Alcohol abuse Father   . Alcohol abuse Maternal Grandmother   . Heart attack Maternal Grandmother   . Stroke Maternal Grandmother   . Cancer Paternal Grandmother     colon    BP 134/84 mmHg  Pulse 89  Ht 5\' 5"  (1.651 m)  Review of Systems: See HPI above.    Objective:  Physical Exam:  Gen: NAD  Left knee: No gross deformity,  ecchymoses, effusion. TTP Lateral patellar facet.  No joint line, other tenderness. FROM. Negative ant/post drawers. Negative valgus/varus testing. Negative lachmanns. Negative mcmurrays, apleys, patellar apprehension. NV intact distally.    Assessment & Plan:  1. Left knee injury - history and exam consistent with patellar subluxation.  Advised to take nsaid (diclofenac), ice knee, use knee sleeve or brace for support.  Start home exercise program which was demonstrated today.  F/u in 1 month for reevaluation.  Consider physical therapy, imaging if not improving.

## 2014-07-02 ENCOUNTER — Other Ambulatory Visit: Payer: 59 | Admitting: Obstetrics & Gynecology

## 2014-07-02 ENCOUNTER — Other Ambulatory Visit: Payer: Self-pay | Admitting: *Deleted

## 2014-07-02 DIAGNOSIS — R319 Hematuria, unspecified: Secondary | ICD-10-CM

## 2014-07-04 LAB — CULTURE, URINE COMPREHENSIVE
COLONY COUNT: NO GROWTH
ORGANISM ID, BACTERIA: NO GROWTH

## 2014-08-26 ENCOUNTER — Other Ambulatory Visit: Payer: Self-pay | Admitting: Sports Medicine

## 2014-08-26 ENCOUNTER — Other Ambulatory Visit: Payer: Self-pay

## 2014-08-26 DIAGNOSIS — F909 Attention-deficit hyperactivity disorder, unspecified type: Secondary | ICD-10-CM

## 2014-08-26 MED ORDER — AMPHETAMINE-DEXTROAMPHETAMINE 20 MG PO TABS: 20.0000 mg | ORAL_TABLET | Freq: Two times a day (BID) | ORAL | Status: DC

## 2014-09-11 ENCOUNTER — Other Ambulatory Visit: Payer: Self-pay | Admitting: Family Medicine

## 2014-09-11 DIAGNOSIS — N803 Endometriosis of pelvic peritoneum, unspecified: Secondary | ICD-10-CM

## 2014-09-11 MED ORDER — LEUPROLIDE ACETATE (3 MONTH) 11.25 MG IM KIT
11.2500 mg | PACK | INTRAMUSCULAR | Status: DC
Start: 2014-09-11 — End: 2014-10-04

## 2014-09-11 NOTE — Progress Notes (Unsigned)
Depo has run out and symptoms have returned.  Will repeat dose. Will add Aygestin for add back.

## 2014-09-16 ENCOUNTER — Other Ambulatory Visit: Payer: Self-pay

## 2014-09-16 DIAGNOSIS — F909 Attention-deficit hyperactivity disorder, unspecified type: Secondary | ICD-10-CM

## 2014-09-16 MED ORDER — AMPHETAMINE-DEXTROAMPHETAMINE 20 MG PO TABS: 20.0000 mg | ORAL_TABLET | Freq: Two times a day (BID) | ORAL | Status: DC

## 2014-09-16 NOTE — Telephone Encounter (Signed)
Pharmacy only refilled Adderall for a 30 day supply so patient needs a refill for 60. Rx has been placed in provider box tosign. Rhonda Cunningham,CMA

## 2014-10-04 ENCOUNTER — Encounter: Payer: Self-pay | Admitting: Sports Medicine

## 2014-10-04 ENCOUNTER — Ambulatory Visit (INDEPENDENT_AMBULATORY_CARE_PROVIDER_SITE_OTHER): Payer: 59 | Admitting: Sports Medicine

## 2014-10-04 VITALS — BP 125/69 | HR 96 | Wt 214.0 lb

## 2014-10-04 DIAGNOSIS — F909 Attention-deficit hyperactivity disorder, unspecified type: Secondary | ICD-10-CM | POA: Diagnosis not present

## 2014-10-04 DIAGNOSIS — R635 Abnormal weight gain: Secondary | ICD-10-CM | POA: Diagnosis not present

## 2014-10-04 MED ORDER — TOPIRAMATE 50 MG PO TABS: ORAL_TABLET | ORAL | Status: DC

## 2014-10-04 MED ORDER — PHENTERMINE HCL 37.5 MG PO TABS: ORAL_TABLET | ORAL | Status: DC

## 2014-10-04 MED ORDER — AMPHETAMINE-DEXTROAMPHETAMINE 15 MG PO TABS: 15.0000 mg | ORAL_TABLET | Freq: Two times a day (BID) | ORAL | Status: DC

## 2014-10-04 NOTE — Assessment & Plan Note (Signed)
Starting phentermine and Topamax.

## 2014-10-04 NOTE — Assessment & Plan Note (Signed)
Refilling Adderall, dropping down to 15 mg because we are going to be starting phentermine and Topamax.

## 2014-10-04 NOTE — Progress Notes (Signed)
  Subjective:    CC: follow-up  HPI: Attention deficit disorder: No improvement in attention symptoms going up to 20 mg of Adderall, amenable to decrease back down to 15 mg.  Obesity: Like to start weight loss treatment.  Past medical history, Surgical history, Family history not pertinant except as noted below, Social history, Allergies, and medications have been entered into the medical record, reviewed, and no changes needed.   Review of Systems: No fevers, chills, night sweats, weight loss, chest pain, or shortness of breath.   Objective:    General: Well Developed, well nourished, and in no acute distress.  Neuro: Alert and oriented x3, extra-ocular muscles intact, sensation grossly intact.  HEENT: Normocephalic, atraumatic, pupils equal round reactive to light, neck supple, no masses, no lymphadenopathy, thyroid nonpalpable.  Skin: Warm and dry, no rashes. Cardiac: Regular rate and rhythm, no murmurs rubs or gallops, no lower extremity edema.  Respiratory: Clear to auscultation bilaterally. Not using accessory muscles, speaking in full sentences.  Impression and Recommendations:    I spent 25 minutes with this patient, greater than 50% was face-to-face time counseling regarding the above diagnoses

## 2014-10-14 ENCOUNTER — Encounter: Payer: Self-pay | Admitting: Sports Medicine

## 2014-10-30 ENCOUNTER — Telehealth: Payer: 59 | Admitting: Family

## 2014-10-30 DIAGNOSIS — M545 Low back pain: Secondary | ICD-10-CM

## 2014-10-30 NOTE — Progress Notes (Signed)
Based on what you shared with me it looks like you have a serious condition that should be evaluated in a face to face office visit.  If you are having a true medical emergency please call 911.  If you need an urgent face to face visit, [place] has four urgent care centers for your convenience.  . [facility]  [phone] Get Driving Directions Find a Riggins Cisek at this Location  [address] [place], [place] [address] . 8 am to 8 pm Monday-Friday . 9 am to 7 pm Saturday-Sunday  . [facility]  [phone] Get Driving Directions Find a Chastin Garlitz at this Location  [address] [place], [place] [address] . 8 am to 8 pm Monday-Friday . 9 am to 6 pm Saturday . 11 am to 6 pm Sunday   . [facility] [place]  [phone] Get Driving Directions  [address] [place], [place] [address] . 8 am to 8 pm Monday-Friday . 9 am to 4 pm Saturday-Sunday   . [facility] (a walk in primary care [place])  [phone]  Get Driving Directions Find a Syniah Berne at this Location  [address] [place], [place] [address] . 8 am to 8:30 pm Monday-Thursday . 8 am to 6 pm Friday . 8 am to 4 pm Saturday-Sunday   Your e-visit answers were reviewed by a board certified advanced clinical practitioner to complete your personal care plan.  Thank you for using e-Visits. 

## 2014-10-31 ENCOUNTER — Ambulatory Visit (INDEPENDENT_AMBULATORY_CARE_PROVIDER_SITE_OTHER): Payer: 59

## 2014-10-31 ENCOUNTER — Ambulatory Visit (INDEPENDENT_AMBULATORY_CARE_PROVIDER_SITE_OTHER): Payer: 59 | Admitting: Sports Medicine

## 2014-10-31 ENCOUNTER — Encounter: Payer: Self-pay | Admitting: Sports Medicine

## 2014-10-31 VITALS — BP 128/57 | HR 108 | Ht 64.5 in | Wt 212.0 lb

## 2014-10-31 DIAGNOSIS — M545 Low back pain, unspecified: Secondary | ICD-10-CM

## 2014-10-31 DIAGNOSIS — M8588 Other specified disorders of bone density and structure, other site: Secondary | ICD-10-CM

## 2014-10-31 DIAGNOSIS — R635 Abnormal weight gain: Secondary | ICD-10-CM

## 2014-10-31 DIAGNOSIS — M8580 Other specified disorders of bone density and structure, unspecified site: Secondary | ICD-10-CM | POA: Insufficient documentation

## 2014-10-31 LAB — POCT URINALYSIS DIPSTICK
Glucose, UA: NEGATIVE
Leukocytes, UA: NEGATIVE
Nitrite, UA: NEGATIVE
Spec Grav, UA: >=1.03
Urobilinogen, UA: 0.2
pH, UA: 5.5

## 2014-10-31 MED ORDER — METHOCARBAMOL 500 MG PO TABS: 500.0000 mg | ORAL_TABLET | Freq: Three times a day (TID) | ORAL | Status: DC

## 2014-10-31 MED ORDER — KETOROLAC TROMETHAMINE 30 MG/ML IJ SOLN
30.0000 mg | Freq: Once | INTRAMUSCULAR | Status: AC
  Administered 2014-10-31: 30 mg via INTRAMUSCULAR

## 2014-10-31 MED ORDER — TOPIRAMATE 50 MG PO TABS: 50.0000 mg | ORAL_TABLET | Freq: Two times a day (BID) | ORAL | Status: DC

## 2014-10-31 MED ORDER — LIRAGLUTIDE -WEIGHT MANAGEMENT 18 MG/3ML ~~LOC~~ SOPN: 3.0000 mg | PEN_INJECTOR | Freq: Every day | SUBCUTANEOUS | Status: DC

## 2014-10-31 MED ORDER — PREDNISONE 50 MG PO TABS: ORAL_TABLET | ORAL | Status: DC

## 2014-10-31 NOTE — Progress Notes (Addendum)
  Subjective:    CC: Back pain, obesity  HPI: Obesity: Minimal weight loss, had some difficulty initially tolerating Topamax but is doing better.  Back pain: Moderate, persistent, present since September 14, occurred accidentally when moving laundry from washer to dryer, worse with flexion, Valsalva, nothing radicular, no bowel or bladder dysfunction or saddle numbness. She does have some difficulty urinating with dribbling. No constitutional symptoms, no trauma.  Past medical history, Surgical history, Family history not pertinant except as noted below, Social history, Allergies, and medications have been entered into the medical record, reviewed, and no changes needed.   Review of Systems: No fevers, chills, night sweats, weight loss, chest pain, or shortness of breath.   Objective:    General: Well Developed, well nourished, and in no acute distress.  Neuro: Alert and oriented x3, extra-ocular muscles intact, sensation grossly intact.  HEENT: Normocephalic, atraumatic, pupils equal round reactive to light, neck supple, no masses, no lymphadenopathy, thyroid nonpalpable.  Skin: Warm and dry, no rashes. Cardiac: Regular rate and rhythm, no murmurs rubs or gallops, no lower extremity edema.  Respiratory: Clear to auscultation bilaterally. Not using accessory muscles, speaking in full sentences. Back Exam:  Inspection: Unremarkable  Motion: Flexion 45 deg, Extension 45 deg, Side Bending to 45 deg bilaterally,  Rotation to 45 deg bilaterally  SLR laying: Negative  XSLR laying: Negative  Palpable tenderness: None. FABER: negative. Sensory change: Gross sensation intact to all lumbar and sacral dermatomes.  Reflexes: 2+ at both patellar tendons, 2+ at achilles tendons, Babinski's downgoing.  Strength at foot  Plantar-flexion: 5/5 Dorsi-flexion: 5/5 Eversion: 5/5 Inversion: 5/5  Leg strength  Quad: 5/5 Hamstring: 5/5 Hip flexor: 5/5 Hip abductors: 5/5  Gait unremarkable. No  costovertebral angle pain.  Urinalysis positive for blood, protein. No leukocytes.  Impression and Recommendations:

## 2014-10-31 NOTE — Assessment & Plan Note (Signed)
Urinalysis. X-rays, home rehabilitation, prednisone, Robaxin. Toradol 30 intramuscular.

## 2014-10-31 NOTE — Assessment & Plan Note (Signed)
Discontinue phentermine. Increase Topamax to 50 mg twice a day, adding Saxenda, I'm also going to write a prescription for Victoza if Kirke Shaggy is not covered. Return in one month.

## 2014-11-01 ENCOUNTER — Ambulatory Visit: Payer: 59 | Admitting: Sports Medicine

## 2014-11-04 ENCOUNTER — Telehealth: Payer: Self-pay

## 2014-11-04 ENCOUNTER — Ambulatory Visit (HOSPITAL_BASED_OUTPATIENT_CLINIC_OR_DEPARTMENT_OTHER)
Admission: RE | Admit: 2014-11-04 | Discharge: 2014-11-04 | Disposition: A | Discharge status: Home / Self Care | Payer: 59 | Source: Ambulatory Visit | Attending: Sports Medicine | Admitting: Sports Medicine

## 2014-11-04 ENCOUNTER — Encounter: Payer: Self-pay | Admitting: Sports Medicine

## 2014-11-04 DIAGNOSIS — M8580 Other specified disorders of bone density and structure, unspecified site: Secondary | ICD-10-CM | POA: Insufficient documentation

## 2014-11-04 NOTE — Telephone Encounter (Signed)
Physical therapy is whats needed and there is no quick fix, the problem is not unloading the discs.  If she wants to see a chiropracter she is welcome to google anyone in the area.

## 2014-11-04 NOTE — Telephone Encounter (Signed)
Patient wants to know if you know of anyone that does spinal manipulation here in the office to relieve pressure since she is going back to work tomorrow. Rhonda Cunningham,CMA

## 2014-11-05 ENCOUNTER — Ambulatory Visit: Payer: Self-pay | Admitting: Sports Medicine

## 2014-11-05 ENCOUNTER — Telehealth: Payer: Self-pay | Admitting: Sports Medicine

## 2014-11-05 NOTE — Telephone Encounter (Signed)
Fo sho!

## 2014-11-05 NOTE — Telephone Encounter (Signed)
Pt called and wanted to make sure its okay to switch her pcp from Dr.Thekkekandam to Dr.Natalie Sheppard Coil now that we have a female provider taking patients??? She states that she just prefers a female and its easier for her to talk to about certain issues. Let me know this is okay. Thanks, Oswaldo Milian

## 2014-11-05 NOTE — Telephone Encounter (Signed)
LEFT DETAILED MESSAGE ON PATIENT VM WITH INFORMATION AS NOTED BELOW. Rhonda Cunningham,CMA  

## 2014-11-07 ENCOUNTER — Encounter: Payer: Self-pay | Admitting: Family Medicine

## 2014-11-07 ENCOUNTER — Ambulatory Visit (INDEPENDENT_AMBULATORY_CARE_PROVIDER_SITE_OTHER): Payer: 59 | Admitting: Family Medicine

## 2014-11-07 VITALS — BP 109/74 | HR 101 | Ht 65.0 in

## 2014-11-07 DIAGNOSIS — S8992XA Unspecified injury of left lower leg, initial encounter: Secondary | ICD-10-CM | POA: Diagnosis not present

## 2014-11-07 NOTE — Patient Instructions (Signed)
You have strained your quadriceps tendon but also have some very mild synovitis. Icing 15 minutes at a time 3-4 times a day. Tylenol 500mg  1-2 tabs three times a day for pain. Ibuprofen 600mg  three times a day with food only if needed for pain and inflammation. Quad strap or sleeve when up and walking around for comfort. Straight leg raises, knee extensions 3 sets of 10 once a day.  Add ankle weight if these are too easy. If not improving can consider physical therapy, intraarticular injection, nitro patches. Call me in a week if you're not improving and want to do the injection. Otherwise follo wup with me in about a month.

## 2014-11-12 ENCOUNTER — Telehealth: Payer: Self-pay | Admitting: *Deleted

## 2014-11-12 ENCOUNTER — Ambulatory Visit: Payer: 59 | Admitting: *Deleted

## 2014-11-12 VITALS — BP 98/62 | HR 79 | Resp 16

## 2014-11-12 DIAGNOSIS — N809 Endometriosis, unspecified: Secondary | ICD-10-CM

## 2014-11-12 MED ORDER — MEDROXYPROGESTERONE ACETATE 150 MG/ML IM SUSP
150.0000 mg | Freq: Once | INTRAMUSCULAR | Status: AC
  Administered 2014-11-12: 150 mg via INTRAMUSCULAR

## 2014-11-12 MED ORDER — MEDROXYPROGESTERONE ACETATE 150 MG/ML IM SUSP
150.0000 mg | INTRAMUSCULAR | Status: DC
Start: 2014-11-12 — End: 2015-04-02

## 2014-11-12 NOTE — Assessment & Plan Note (Signed)
consistent with quad tendon strain, mild synovitis.  By ultrasound has a very mild effusion as well.  Discussed options - start with home exercises which were demonstrated, sleeve or quad tendon strap, icing, tylenol, nsaids.  Consider PT, intraarticular injection, nitro patches.  F/u in 1 month.

## 2014-11-12 NOTE — Telephone Encounter (Signed)
Rf request for Depo Provera 150 mg sent to [place].

## 2014-11-12 NOTE — Progress Notes (Signed)
PCP: Emeterio Reeve, DO  Subjective:   HPI: Patient is a 39 y.o. female here for left knee pain.  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 She was braced with left leg forward, patient slowly lowered over left leg and Johara felt like left knee was pushed downward as the patient was stuck on top of her left knee. + swelling. Taking ibuprofen and icing. Then on Sunday was coming down the steps when she felt a tear sensation in left knee. Pain is around the patella mainly laterally. No catching or locking.  9/22: Patient reports she was walking dogs a few days ago and stepped wrong, lost balance, and tensed up her left knee. No swelling or bruising. Has been icing. Was on prednisone then ibuprofen recently. Pain felt just above the kneecap.  Past Medical History  Diagnosis Date  . ADHD (attention deficit hyperactivity disorder)   . GERD (gastroesophageal reflux disease)   . Endometriosis STAGE 3    Current Outpatient Prescriptions on File Prior to Visit  Medication Sig Dispense Refill  . amphetamine-dextroamphetamine (ADDERALL) 15 MG tablet Take 1 tablet by mouth 2 (two) times daily. 60 tablet 0  . hydrocortisone (ANUSOL-HC) 25 MG suppository Place 1 suppository (25 mg total) rectally 2 (two) times daily. (Patient taking differently: Place 25 mg rectally as needed. ) 12 suppository 1  . Liraglutide -Weight Management (SAXENDA) 18 MG/3ML SOPN Inject 3 mg into the skin daily. 0.6 mg injected subcutaneously daily for 1 week, then increase by 0.6 mg weekly until reaching 3 mg injected subcutaneous daily 3 pen 0  . methocarbamol (ROBAXIN) 500 MG tablet Take 1 tablet (500 mg total) by mouth 3 (three) times daily. 90 tablet 0  . phentermine (ADIPEX-P) 37.5 MG tablet One tab by mouth qAM 30 tablet 0  . predniSONE (DELTASONE) 50 MG tablet One tab PO daily for 5 days. 5 tablet 0  . topiramate  (TOPAMAX) 50 MG tablet Take 1 tablet (50 mg total) by mouth 2 (two) times daily. One half tab by mouth daily for a week, then one tab by mouth daily. 60 tablet 3   No current facility-administered medications on file prior to visit.    Past Surgical History  Procedure Laterality Date  . Ankle reconstruction Bilateral 1997  . Cholecystectomy    . Wisdom tooth extraction Bilateral   . Uterine ablation      Allergies  Allergen Reactions  . Norflex [Orphenadrine]     CAUSES HEADACHES  . Other Other (See Comments)    Oral Pain Medications  . Penicillins Nausea And Vomiting  . Tramadol-Acetaminophen Anxiety    Social History   Social History  . Marital Status: Married    Spouse Name: N/A  . Number of Children: N/A  . Years of Education: N/A   Occupational History  . [employer]   Social History Main Topics  . Smoking status: Current Some Day Smoker -- 0.50 packs/day for 10 years    Types: Cigarettes    Last Attempt to Quit: 05/30/2012  . Smokeless tobacco: Never Used     Comment: 1/2 pack every 3 days  . Alcohol Use: No  . Drug Use: Not on file  . Sexual Activity: Yes   Other Topics Concern  . Not on file   Social History Narrative    Family History  Problem Relation Age of Onset  .  Diabetes Mother   . Hyperlipidemia Mother   . Hypertension Mother   . Diabetes Sister   . Depression Sister   . Alcohol abuse Father   . Alcohol abuse Maternal Grandmother   . Heart attack Maternal Grandmother   . Stroke Maternal Grandmother   . Cancer Paternal Grandmother     colon    BP 109/74 mmHg  Pulse 101  Ht 5\' 5"  (1.651 m)  Review of Systems: See HPI above.    Objective:  Physical Exam:  Gen: NAD  Left knee: No gross deformity, ecchymoses, effusion. TTP quad tendon.  No joint line, other tenderness. FROM. Negative ant/post drawers. Negative valgus/varus testing. Negative lachmanns. Negative mcmurrays, apleys, patellar apprehension. NV intact  distally.    Assessment & Plan:  1. Left knee injury - consistent with quad tendon strain, mild synovitis.  By ultrasound has a very mild effusion as well.  Discussed options - start with home exercises which were demonstrated, sleeve or quad tendon strap, icing, tylenol, nsaids.  Consider PT, intraarticular injection, nitro patches.  F/u in 1 month.

## 2014-11-18 ENCOUNTER — Encounter: Payer: Self-pay | Admitting: Osteopathic Medicine

## 2014-11-18 ENCOUNTER — Encounter: Payer: Self-pay | Admitting: Sports Medicine

## 2014-11-25 ENCOUNTER — Other Ambulatory Visit: Payer: Self-pay | Admitting: Sports Medicine

## 2014-11-25 ENCOUNTER — Ambulatory Visit (INDEPENDENT_AMBULATORY_CARE_PROVIDER_SITE_OTHER): Payer: 59

## 2014-11-25 DIAGNOSIS — M5023 Other cervical disc displacement, cervicothoracic region: Secondary | ICD-10-CM

## 2014-11-25 DIAGNOSIS — M5126 Other intervertebral disc displacement, lumbar region: Secondary | ICD-10-CM

## 2014-11-25 DIAGNOSIS — M545 Low back pain, unspecified: Secondary | ICD-10-CM

## 2014-11-28 ENCOUNTER — Other Ambulatory Visit: Payer: Self-pay | Admitting: *Deleted

## 2014-11-28 ENCOUNTER — Telehealth: Payer: Self-pay | Admitting: *Deleted

## 2014-11-28 ENCOUNTER — Encounter: Payer: Self-pay | Admitting: Sports Medicine

## 2014-11-28 DIAGNOSIS — Z020 Encounter for examination for admission to educational institution: Secondary | ICD-10-CM

## 2014-11-28 DIAGNOSIS — Z139 Encounter for screening, unspecified: Secondary | ICD-10-CM

## 2014-11-28 NOTE — Telephone Encounter (Signed)
Pt needs TB for school exam

## 2014-11-29 ENCOUNTER — Encounter: Payer: Self-pay | Admitting: Physical Therapy

## 2014-11-29 ENCOUNTER — Ambulatory Visit (INDEPENDENT_AMBULATORY_CARE_PROVIDER_SITE_OTHER): Payer: 59 | Admitting: Physical Therapy

## 2014-11-29 ENCOUNTER — Ambulatory Visit: Payer: 59 | Admitting: Sports Medicine

## 2014-11-29 DIAGNOSIS — M545 Low back pain, unspecified: Secondary | ICD-10-CM

## 2014-11-29 DIAGNOSIS — R29898 Other symptoms and signs involving the musculoskeletal system: Secondary | ICD-10-CM

## 2014-11-29 DIAGNOSIS — M6281 Muscle weakness (generalized): Secondary | ICD-10-CM

## 2014-11-29 DIAGNOSIS — M2569 Stiffness of other specified joint, not elsewhere classified: Secondary | ICD-10-CM

## 2014-11-29 DIAGNOSIS — M2560 Stiffness of unspecified joint, not elsewhere classified: Secondary | ICD-10-CM | POA: Diagnosis not present
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
## 2014-11-29 NOTE — Patient Instructions (Signed)
On Elbows (Prone)    Rise up on elbows as high as possible, keeping hips on floor. Hold _60-120___ seconds. Repeat _1___ times per set. Do _1___ sets per session. Do _2___ sessions per day.  Press-Up    Press upper body upward, keeping hips in contact with floor. Keep lower back and buttocks relaxed. Hold __2-3__ seconds. Repeat _5-10___ times per set. Do _1___ sets per session. Do __2__ sessions per day.   Pelvic Tilt    Flatten back by tightening stomach muscles, then relax and rotate pelvis forward. Repeat _10___ times per set. Do ____ sets per session. Do __2__ sessions per day.    Copyright  VHI. All rights reserved.

## 2014-11-30 LAB — QUANTIFERON TB GOLD ASSAY (BLOOD)
Interferon Gamma Release Assay: NEGATIVE
Mitogen value: 1.65 IU/mL
QUANTIFERON NIL VALUE: 0.02 [IU]/mL
Quantiferon Tb Ag Minus Nil Value: -0.01 IU/mL
TB Ag value: 0.01 IU/mL

## 2014-12-02 ENCOUNTER — Ambulatory Visit (INDEPENDENT_AMBULATORY_CARE_PROVIDER_SITE_OTHER): Payer: 59 | Admitting: Physical Therapy

## 2014-12-02 ENCOUNTER — Encounter: Payer: Self-pay | Admitting: Physical Therapy

## 2014-12-02 DIAGNOSIS — M545 Low back pain, unspecified: Secondary | ICD-10-CM

## 2014-12-02 DIAGNOSIS — M2569 Stiffness of other specified joint, not elsewhere classified: Secondary | ICD-10-CM

## 2014-12-02 DIAGNOSIS — R29898 Other symptoms and signs involving the musculoskeletal system: Secondary | ICD-10-CM

## 2014-12-02 DIAGNOSIS — M2560 Stiffness of unspecified joint, not elsewhere classified: Secondary | ICD-10-CM | POA: Diagnosis not present

## 2014-12-02 DIAGNOSIS — M6281 Muscle weakness (generalized): Secondary | ICD-10-CM | POA: Diagnosis not present

## 2014-12-02 NOTE — Therapy (Signed)
[place], [place], [address] Phone: [phone]   Fax:  [phone]  Physical Therapy Treatment  Patient Details  Name: Rebecca Maxwell MRN: [id number] Date of Birth: 14-Oct-1975 Referring Provider: Vickki Hearing  Encounter Date: 12/02/2014      PT End of Session - 12/02/14 0742    Visit Number 2   Number of Visits 8   Date for PT Re-Evaluation 12/27/14   PT Start Time 0735   PT Stop Time 0802   PT Time Calculation (min) 27 min      Past Medical History  Diagnosis Date  . ADHD (attention deficit hyperactivity disorder)   . GERD (gastroesophageal reflux disease)   . Endometriosis STAGE 3    Past Surgical History  Procedure Laterality Date  . Ankle reconstruction Bilateral 1997  . Cholecystectomy    . Wisdom tooth extraction Bilateral   . Uterine ablation      There were no vitals filed for this visit.  Visit Diagnosis:  Right-sided low back pain without sciatica  Back stiffness  Weakness of back      Subjective Assessment - 12/02/14 0744    Subjective Pt has started performing Tia Chia and is still able to perform the press ups   Currently in Pain? Yes   Pain Score 2    Pain Location Back   Pain Orientation Right;Left;Lower   Pain Descriptors / Indicators Aching;Dull   Pain Type Acute pain   Aggravating Factors  extension at end range, standing still   Pain Relieving Factors walking                         [facility] Adult PT Treatment/Exercise - 12/02/14 0001    Lumbar Exercises: Stretches   ITB Stretch 10 seconds;5 reps  cross body stetch with strap   Lumbar Exercises: Supine   Ab Set 10 reps   Clam 10 reps  with TA contraction   Bent Knee Raise 10 reps  with TA contractions   Other Supine Lumbar Exercises pelvic press and with knee flex/ext x 10   Manual Therapy   Manual Therapy Joint mobilization   Manual therapy comments tightness in Rt gluts  and lumbar paraspinals is better, some tightness in Rt QL   Joint Mobilization grade III Rt UPA at L4-5 on articular pillar and transvers, abdominis                PT Education - 12/02/14 0800    Education provided Yes   Education Details HEP beginner core stabilization   Person(s) Educated Patient   Methods Explanation;Handout   Comprehension Tactile cues required;Verbal cues required;Returned demonstration             PT Long Term Goals - 11/29/14 1520    PT LONG TERM GOAL #1   Title I with advanced HEP ( 12/27/14)    Time 4   Period Weeks   Status New   PT LONG TERM GOAL #2   Title Report pain decrease in her back =/> 75% with daily activity ( 12/27/14)    Time 4   Period Weeks   Status New   PT LONG TERM GOAL #3   Title demo good conraction of multifidi with ther ex ( 12/27/14)    Time 4   Period Weeks   Status New   PT LONG TERM GOAL #4   Title return to prior level of exercise without  difficulty ( 12/27/14)    Time 4   Period Weeks   Status New   PT LONG TERM GOAL #5   Title improve FOTO =/< 31% limited ( 12/27/14)    Time 4   Period Weeks   Status New               Plan - 12/02/14 1043    Clinical Impression Statement This is Mandys second visit, no goal met.  She maintained the back extension gained after her first viist and only has pain at endrange now.  She is very weak in her deep core muscles and requires VC for form to contract them.     Pt will benefit from skilled therapeutic intervention in order to improve on the following deficits Decreased strength;Pain;Hypomobility;Increased muscle spasms;Decreased range of motion   Rehab Potential Excellent   PT Frequency 2x / week   PT Duration 4 weeks   PT Treatment/Interventions Ultrasound;Traction;Patient/family education;Dry needling;Cryotherapy;Electrical Stimulation;Moist Heat;Therapeutic exercise;Manual techniques   PT Next Visit Plan spinal mobs PRN and STW, core stab ex.     Consulted and Agree with Plan of Care Patient        Problem List Patient Active Problem List   Diagnosis Date Noted  . Low back pain 10/31/2014  . Osteopenia determined by x-ray 10/31/2014  . Abnormal weight gain 10/04/2014  . Left knee injury 04/17/2014  . 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 Migraine with aura 12/26/2012  . 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 Quit smoking 09/18/2012  . Preventive measure 03/31/2012  . ADHD (attention deficit hyperactivity disorder)   . GERD (gastroesophageal reflux disease)     Jeral Pinch PT 12/02/2014, 10:44 AM  [facility] [place], [place], [address] Phone: [phone]   Fax:  [phone]  Name: Rebecca Maxwell MRN: [id number] Date of Birth: August 03, 1975

## 2014-12-02 NOTE — Patient Instructions (Signed)
Pelvic Press      [place] [phone]    Place hands under belly between navel and pubic bone, palms up. Feel pressure on hands. Increase pressure on hands by pressing pelvis down. This is NOT a pelvic tilt. Hold _5__ seconds. Relax. Repeat _10__ times.  Prone Heel-to-Buttocks    Lying on stomach, perform pelvic pelvis, bring heel of one foot toward buttocks while keeping the press. Perform 10 reps. Repeat with other leg. Then bend both knees together.  Repeat _10___ times. Do __1__ sessions per day.   Abdominal Bracing With Pelvic Floor (Hook-Lying)   With neutral spine, tighten pelvic floor and abdominals. Hold 5 seconds. Repeat __10_ times. Do _1__ times a day.   Knee to Chest: Transverse Plane Stability   Bring one knee up, then return. Be sure pelvis does not roll side to side. Keep pelvis still. Lift knee __10_ times each leg. Restabilize pelvis. Repeat with other leg. Do _1-2__ sets, _1__ times per day.   Hip External Rotation With Pillow: Transverse Plane Stability   One knee bent, one leg straight, on pillow. Slowly roll bent knee out. Be sure pelvis does not rotate. Do _10__ times. Restabilize pelvis. Repeat with other leg. Do _1-2__ sets, _1__ times per day.

## 2014-12-03 ENCOUNTER — Ambulatory Visit (INDEPENDENT_AMBULATORY_CARE_PROVIDER_SITE_OTHER): Payer: 59 | Admitting: Osteopathic Medicine

## 2014-12-03 VITALS — BP 111/72 | HR 94 | Wt 207.0 lb

## 2014-12-03 DIAGNOSIS — F909 Attention-deficit hyperactivity disorder, unspecified type: Secondary | ICD-10-CM | POA: Diagnosis not present

## 2014-12-03 DIAGNOSIS — R635 Abnormal weight gain: Secondary | ICD-10-CM | POA: Diagnosis not present

## 2014-12-03 DIAGNOSIS — E669 Obesity, unspecified: Secondary | ICD-10-CM

## 2014-12-03 MED ORDER — AMPHETAMINE-DEXTROAMPHET ER 20 MG PO CP24: 20.0000 mg | ORAL_CAPSULE | ORAL | Status: DC

## 2014-12-03 MED ORDER — TOPIRAMATE 50 MG PO TABS: 25.0000 mg | ORAL_TABLET | Freq: Two times a day (BID) | ORAL | Status: DC

## 2014-12-03 MED ORDER — AMPHETAMINE-DEXTROAMPHETAMINE 20 MG PO TABS: 20.0000 mg | ORAL_TABLET | Freq: Two times a day (BID) | ORAL | Status: DC

## 2014-12-03 MED ORDER — VICTOZA 18 MG/3ML ~~LOC~~ SOPN: 1.8000 mg | PEN_INJECTOR | Freq: Every day | SUBCUTANEOUS | Status: DC

## 2014-12-03 NOTE — Progress Notes (Signed)
HPI Rebecca Maxwell is a 39 y.o. female who presents to [facility]  today for chief complaint of:  Chief Complaint  Patient presents with  . Medication Management    follow up    ADHD - Adderall was lowered to 15 mg when started the phentermine from 20 mg. Requests increase dose Adderall now that she is not on phentermine anymore she has noticed worsening attention problems.   WEIGHT/OBESITY -  Weight loss reports being more successful on Victoza. Needs refill on Victoza. Has been off phentermine But still taking Topamax. On Depo for contraception.   Filed Vitals:   12/03/14 0823  Weight: 207 lb (93.895 kg)  10/31/14 Wt 212 = 5 lb/month   Past medical, social and family history reviewed: Past Medical History  Diagnosis Date  . ADHD (attention deficit hyperactivity disorder)   . GERD (gastroesophageal reflux disease)   . Endometriosis STAGE 3   Past Surgical History  Procedure Laterality Date  . Ankle reconstruction Bilateral 1997  . Cholecystectomy    . Wisdom tooth extraction Bilateral   . Uterine ablation     Social History  Substance Use Topics  . Smoking status: Current Some Day Smoker -- 0.50 packs/day for 10 years    Types: Cigarettes    Last Attempt to Quit: 05/30/2012  . Smokeless tobacco: Never Used     Comment: 1/2 pack every 3 days  . Alcohol Use: No   Family History  Problem Relation Age of Onset  . Diabetes Mother   . Hyperlipidemia Mother   . Hypertension Mother   . Diabetes Sister   . Depression Sister   . Alcohol abuse Father   . Alcohol abuse Maternal Grandmother   . Heart attack Maternal Grandmother   . Stroke Maternal Grandmother   . Cancer Paternal Grandmother     colon    Current Outpatient Prescriptions  Medication Sig Dispense Refill  . amphetamine-dextroamphetamine (ADDERALL) 15 MG tablet Take 1 tablet by mouth 2 (two) times daily. 60 tablet 0  . hydrocortisone (ANUSOL-HC) 25 MG suppository Place  1 suppository (25 mg total) rectally 2 (two) times daily. (Patient taking differently: Place 25 mg rectally as needed. ) 12 suppository 1  . medroxyPROGESTERone (DEPO-PROVERA) 150 MG/ML injection Inject 1 mL (150 mg total) into the muscle every 3 (three) months. (Patient taking differently: Inject 150 mg into the muscle every 6 (six) months. ) 1 mL 1  . methocarbamol (ROBAXIN) 500 MG tablet Take 1 tablet (500 mg total) by mouth 3 (three) times daily. 90 tablet 0  . topiramate (TOPAMAX) 50 MG tablet Take 1 tablet (50 mg total) by mouth 2 (two) times daily. One half tab by mouth daily for a week, then one tab by mouth daily. 60 tablet 3  . VICTOZA 18 MG/3ML SOPN   1   No current facility-administered medications for this visit.   Allergies  Allergen Reactions  . Norflex [Orphenadrine]     CAUSES HEADACHES  . Other Other (See Comments)    Oral Pain Medications  . Penicillins Nausea And Vomiting  . Tramadol-Acetaminophen Anxiety      Review of Systems: CONSTITUTIONAL: Neg fever/chills, (+) unintentional weight changes hx weight gas, obesity as per HPI HEAD/EYES/EARS/NOSE/THROAT: No headache/vision change or hearing change, no sore throat CARDIAC: No chest pain/pressure/palpitations, no orthopnea RESPIRATORY: No cough/shortness of breath/wheeze GASTROINTESTINAL: No nausea/vomiting/abdominal pain/blood in stool/diarrhea/constipation MUSCULOSKELETAL: No myalgia/arthralgia GENITOURINARY: No incontinence, No abnormal genital bleeding/discharge SKIN: No rash/wounds/concerning lesions HEM/ONC: No  easy bruising/bleeding, no abnormal lymph node ENDOCRINE: No polyuria/polydipsia/polyphagia, no heat/cold intolerance  NEUROLOGIC: No weakness/dizzines/slurred speech PSYCHIATRIC: No concerns with depression/anxiety or sleep problems    Exam:  BP 111/72 mmHg  Pulse 94  Wt 207 lb (93.895 kg) Constitutional: VSS, see above. General Appearance: alert, well-developed, well-nourished, NAD Eyes: Normal  lids and conjunctive, non-icteric sclera, PERRLA Ears, Nose, Mouth, Throat: Normal external inspection ears/nares/mouth/lips/gums, Normal TM bilaterally, MMM, posterior pharynx without erythema/exudate Neck: No masses, trachea midline. No thyroid enlargement/tenderness/mass appreciated Respiratory: Normal respiratory effort. no wheeze/rhonchi/rales Cardiovascular: S1/S2 normal, no murmur/rub/gallop auscultated. RRR. nlower extremity edema. Gastrointestinal: Nontender, no masses. No hepatomegaly, no splenomegaly. No hernia appreciated. Bowel sounds normal. Rectal exam deferred.  Musculoskeletal: Gait normal. No clubbing/cyanosis of digits.  Neurological: No cranial nerve deficit on limited exam. Motor and sensation intact and symmetric Psychiatric: Normal judgment/insight. Normal mood and affect. Oriented x3.    No results found for this or any previous visit (from the past 72 hour(s)).    ASSESSMENT/PLAN:  Attention deficit hyperactivity disorder (ADHD), unspecified ADHD type - Plan: amphetamine-dextroamphetamine (ADDERALL) 20 MG tablet  Obesity - Plan: VICTOZA 18 MG/3ML SOPN, topiramate (TOPAMAX) 50 MG tablet  RTC 1 month weight check and refill Adderall   Pt states she gets annual physical at [facility] office. Any questions/concerns about findings there she will need to make appointment here for further discussion.

## 2014-12-04 ENCOUNTER — Ambulatory Visit: Payer: 59 | Admitting: Physical Therapy

## 2014-12-05 ENCOUNTER — Ambulatory Visit (INDEPENDENT_AMBULATORY_CARE_PROVIDER_SITE_OTHER): Payer: 59 | Admitting: Physical Therapy

## 2014-12-05 ENCOUNTER — Encounter: Payer: Self-pay | Admitting: Physical Therapy

## 2014-12-05 DIAGNOSIS — M2560 Stiffness of unspecified joint, not elsewhere classified: Secondary | ICD-10-CM

## 2014-12-05 DIAGNOSIS — M6281 Muscle weakness (generalized): Secondary | ICD-10-CM

## 2014-12-05 DIAGNOSIS — M545 Low back pain, unspecified: Secondary | ICD-10-CM

## 2014-12-05 DIAGNOSIS — M2569 Stiffness of other specified joint, not elsewhere classified: Secondary | ICD-10-CM

## 2014-12-05 DIAGNOSIS — R29898 Other symptoms and signs involving the musculoskeletal system: Secondary | ICD-10-CM

## 2014-12-05 NOTE — Patient Instructions (Signed)

## 2014-12-05 NOTE — Therapy (Signed)
[place], [place], [address] Phone: [phone]   Fax:  [phone]  Physical Therapy Treatment  Patient Details  Name: Rebecca Maxwell MRN: [id number] Date of Birth: 26-Jun-1975 Referring Provider: Vickki Hearing  Encounter Date: 12/05/2014      PT End of Session - 12/05/14 1738    Visit Number 3   Number of Visits 8   Date for PT Re-Evaluation 12/27/14   PT Start Time 2376   PT Stop Time 1801   PT Time Calculation (min) 55 min      Past Medical History  Diagnosis Date  . ADHD (attention deficit hyperactivity disorder)   . GERD (gastroesophageal reflux disease)   . Endometriosis STAGE 3    Past Surgical History  Procedure Laterality Date  . Ankle reconstruction Bilateral 1997  . Cholecystectomy    . Wisdom tooth extraction Bilateral   . Uterine ablation      There were no vitals filed for this visit.  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 She continued to ice as heat doesn't help.    Currently in Pain? Yes   Pain Score 6    Pain Location Back   Pain Orientation Left;Right;Lower   Pain Descriptors / Indicators Aching;Dull   Pain Type Acute pain   Pain Frequency Constant   Aggravating Factors  extension, prolonged standing   Pain Relieving Factors walking, reccumbnet elliptical.                          [facility] Adult PT Treatment/Exercise - 12/05/14 0001    Lumbar Exercises: Stretches   Quadruped Mid Back Stretch --  10 reps   Moist Heat Therapy   Number Minutes Moist Heat 15 Minutes   Moist Heat Location Lumbar Spine   Electrical Stimulation   Electrical Stimulation Location lumbar  in rt lumbar  mulitifidi with needles.    Electrical Stimulation Action high volt on needles   Electrical Stimulation Parameters to tolerance   Electrical Stimulation Goals Tone;Pain   Manual Therapy   Manual Therapy Joint mobilization;Myofascial release   Joint Mobilization grade III Rt UPA at L4-5 on articular pillar and transvers, abdominis   Myofascial Release Rt lumbar multifidi          Trigger Point Dry Needling - 12/05/14 1736    Consent Given? Yes   Education Handout Provided Yes   Muscles Treated Lower Body --  Rt lumbar multifidi with good twitch response Lt side withou              PT Education - 12/05/14 1752    Education provided Yes   Education Details TDN   Person(s) Educated Patient   Methods Explanation;Handout   Comprehension Verbalized understanding             PT Long Term Goals - 11/29/14 1520    PT LONG TERM GOAL #1   Title I with advanced HEP ( 12/27/14)    Time 4   Period Weeks   Status New   PT LONG TERM GOAL #2   Title Report pain decrease in her back =/> 75% with daily activity ( 12/27/14)  Time 4   Period Weeks   Status New   PT LONG TERM GOAL #3   Title demo good conraction of multifidi with ther ex ( 12/27/14)    Time 4   Period Weeks   Status New   PT LONG TERM GOAL #4   Title return to prior level of exercise without difficulty ( 12/27/14)    Time 4   Period Weeks   Status New   PT LONG TERM GOAL #5   Title improve FOTO =/< 31% limited ( 12/27/14)    Time 4   Period Weeks   Status New               Plan - 12/05/14 1756    Clinical Impression Statement Rebecca Maxwell has very tight lumbar paraspinals, had good twitch response with dry needling. Pain down to 2/10 after treatment.  Discussed with patient that backs take a little longer to resolve the tight musculature as we can't rest them like other muscles. .    Pt will benefit from skilled therapeutic intervention in order to improve on the following deficits Decreased  strength;Pain;Hypomobility;Increased muscle spasms;Decreased range of motion   Rehab Potential Excellent   PT Frequency 2x / week   PT Duration 4 weeks   PT Treatment/Interventions Ultrasound;Traction;Patient/family education;Dry needling;Cryotherapy;Electrical Stimulation;Moist Heat;Therapeutic exercise;Manual techniques   PT Next Visit Plan hip rotation stretched into IR and hip mobs along with spinal mobs.    Consulted and Agree with Plan of Care Patient        Problem List Patient Active Problem List   Diagnosis Date Noted  . Low back pain 10/31/2014  . Osteopenia determined by x-ray 10/31/2014  . Abnormal weight gain 10/04/2014  . Left knee injury 04/17/2014  . 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 Migraine with aura 12/26/2012  . 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 Quit smoking 09/18/2012  . Preventive measure 03/31/2012  . ADHD (attention deficit hyperactivity disorder)   . GERD (gastroesophageal reflux disease)     Jeral Pinch PT 12/05/2014, 6:07 PM  [facility] [place], [place], [address] Phone: [phone]   Fax:  [phone]  Name: Rebecca Maxwell MRN: [id number] Date of Birth: 05/26/1975

## 2014-12-11 ENCOUNTER — Ambulatory Visit (INDEPENDENT_AMBULATORY_CARE_PROVIDER_SITE_OTHER): Payer: 59 | Admitting: Physical Therapy

## 2014-12-11 ENCOUNTER — Encounter: Payer: Self-pay | Admitting: Physical Therapy

## 2014-12-11 DIAGNOSIS — M545 Low back pain, unspecified: Secondary | ICD-10-CM

## 2014-12-11 DIAGNOSIS — M2560 Stiffness of unspecified joint, not elsewhere classified: Secondary | ICD-10-CM

## 2014-12-11 DIAGNOSIS — R29898 Other symptoms and signs involving the musculoskeletal system: Secondary | ICD-10-CM

## 2014-12-11 DIAGNOSIS — M6281 Muscle weakness (generalized): Secondary | ICD-10-CM

## 2014-12-11 DIAGNOSIS — M2569 Stiffness of other specified joint, not elsewhere classified: Secondary | ICD-10-CM

## 2014-12-11 NOTE — Patient Instructions (Addendum)
Leg Lift: One-Leg       [place] [phone]    Press pelvis down. Keep knee straight; lengthen and lift one leg (from waist). Do not twist body. Keep other leg down. Hold __1_ seconds. Relax. Repeat 10 time. Repeat with other leg. Once a day.   Shoulder Blade Squeeze: Arms at Sides    Arms at sides, parallel, elbows straight, palms up. Press pelvis down. Squeeze backbone with shoulder blades, raising front of shoulders, chest, and arms. Keep head and neck neutral. Hold _1__ seconds. Relax. Once a day. Repeat _10__ times.  Shoulder Blade Squeeze: W    Arms out to sides at 90 palms down. Bend elbows to 90. Press pelvis down. Squeeze backbone with shoulder blades. Raise arms, front of shoulders, chest, and head. Keep neck neutral. Hold _1__ seconds. Relax. Repeat _10__ times.  Shoulder Blade Squeeze: Airplane    Arms out to sides at 90, elbows straight, palms down. Press pelvis down. Squeeze backbone with shoulder blades. Raise arms, front of shoulders, chest, and head. Keep neck neutral. Hold _1__ seconds. Relax. Once a day.  Repeat _10__ times.  Shoulder Blade Squeeze: Superperson    Arms alongside head, elbows straight, palms down. Press pelvis down. Squeeze backbone with shoulder blades. Raise arms, chest, and head. Keep neck neutral. Hold _1__ seconds. Relax. Repeat _10__ times. Once a day.  Copyright  VHI. All rights reserved.  Balance / Reach    Stand on left foot, Holding ____ pound weight in other hand. Bend knee, lowering body, and reach across. Hold ____ seconds. Relax. Repeat ____ times per set. Do ____ sets per session. Do ____ sessions per day.  http://orth.exer.us/90   Copyright  VHI. All rights reserved.

## 2014-12-11 NOTE — Therapy (Signed)
[place], [place], [address] Phone: [phone]   Fax:  [phone]  Physical Therapy Treatment  Patient Details  Name: Rebecca Maxwell MRN: [id number] Date of Birth: July 17, 1975 Referring Provider: Vickki Hearing  Encounter Date: 12/11/2014      PT End of Session - 12/11/14 0707    Visit Number 4   Number of Visits 8   Date for PT Re-Evaluation 12/27/14   PT Start Time 0707   PT Stop Time 0739   PT Time Calculation (min) 32 min      Past Medical History  Diagnosis Date  . ADHD (attention deficit hyperactivity disorder)   . GERD (gastroesophageal reflux disease)   . Endometriosis STAGE 3    Past Surgical History  Procedure Laterality Date  . Ankle reconstruction Bilateral 1997  . Cholecystectomy    . Wisdom tooth extraction Bilateral   . Uterine ablation      There were no vitals filed for this visit.  Visit Diagnosis:  Right-sided low back pain without sciatica  Back stiffness  Weakness of back      Subjective Assessment - 12/11/14 0707    Subjective Pt reports no pain this AM, only has tightness now.  Prolonged standing makes it worse. Overall she reports improvement.    Currently in Pain? No/denies            [facility] PT Assessment - 12/11/14 0001    Assessment   Medical Diagnosis Acute Rt sided LBP withour sciatica   Onset Date/Surgical Date 10/30/14   Next MD Visit 12/27/14   AROM   Right Hip External Rotation  78   Right Hip Internal Rotation  50   Left Hip External Rotation  75   Left Hip Internal Rotation  36   Lumbar Extension WNL   Strength   Right Hip Extension 5/5   Left Hip Extension 5/5   Lumbar Extension --  Rt good, Lt fair (+)                      [facility] Adult PT Treatment/Exercise - 12/11/14 0001    Lumbar Exercises: Stretches   Single Knee to Chest Stretch 20 seconds  bilat   Double Knee to Chest Stretch 20 seconds   Lumbar  Exercises: Supine   Ab Set 5 reps;5 seconds   Clam 10 reps  with biofeedback abdominal stabilzer   Bent Knee Raise 10 reps  with abdominal stabilizer   Straight Leg Raise 10 reps   Other Supine Lumbar Exercises table top with heel taps using abdominal stabilzer   Lumbar Exercises: Prone   Other Prone Lumbar Exercises pelvic press routine, upper and lower body, 10 each with VC for form   Lumbar Exercises: Quadruped   [place] 10 reps   Manual Therapy   Manual Therapy Joint mobilization   Joint Mobilization lumbar grade III, pt with good painfree motion CPA and bilat UPA                     PT Long Term Goals - 12/11/14 0732    PT LONG TERM GOAL #1   Title I with advanced HEP ( 12/27/14)    Status Achieved   PT LONG TERM GOAL #2   Title Report pain decrease in her back =/> 75% with daily activity ( 12/27/14)    Status Achieved  pt reports 100% improvement   PT LONG TERM GOAL #3  Title demo good conraction of multifidi with ther ex ( 12/27/14)    Status Partially Met   PT LONG TERM GOAL #4   Title return to prior level of exercise without difficulty ( 12/27/14)    Status Achieved   PT LONG TERM GOAL #5   Title improve FOTO =/< 31% limited ( 12/27/14)    Status Not Met  Pt has improved to only 34% limited               Plan - 12/11/14 0735    Clinical Impression Statement Pt is doing great, she is at her prior level of function and happy with her progress   PT Next Visit Plan D/C to HEP   Consulted and Agree with Plan of Care Patient        Problem List Patient Active Problem List   Diagnosis Date Noted  . Low back pain 10/31/2014  . Osteopenia determined by x-ray 10/31/2014  . Abnormal weight gain 10/04/2014  . Left knee injury 04/17/2014  . 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 Migraine with aura 12/26/2012  . 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 Quit smoking 09/18/2012  .  Preventive measure 03/31/2012  . ADHD (attention deficit hyperactivity disorder)   . GERD (gastroesophageal reflux disease)     Jeral Pinch PT 12/11/2014, 7:44 AM  [facility] [place], [place], [address] Phone: [phone]   Fax:  [phone]  Name: JERMIAH HOWTON MRN: [id number] Date of Birth: 01/24/1976   PHYSICAL THERAPY DISCHARGE SUMMARY  Visits from Start of Care: 4  Current functional level related to goals / functional outcomes: See above   Remaining deficits: At prior level of function   Education / Equipment: HEP Plan: Patient agrees to discharge.  Patient goals were partially met. Patient is being discharged due to being pleased with the current functional level.  ?????       Jeral Pinch, PT 12/11/2014 7:45 AM

## 2014-12-13 ENCOUNTER — Encounter: Payer: 59 | Admitting: Physical Therapy

## 2015-01-02 ENCOUNTER — Ambulatory Visit (INDEPENDENT_AMBULATORY_CARE_PROVIDER_SITE_OTHER): Payer: 59 | Admitting: Osteopathic Medicine

## 2015-01-02 ENCOUNTER — Encounter: Payer: Self-pay | Admitting: Osteopathic Medicine

## 2015-01-02 VITALS — BP 121/74 | HR 109 | Ht 65.0 in | Wt 197.0 lb

## 2015-01-02 DIAGNOSIS — F909 Attention-deficit hyperactivity disorder, unspecified type: Secondary | ICD-10-CM | POA: Diagnosis not present

## 2015-01-02 DIAGNOSIS — E669 Obesity, unspecified: Secondary | ICD-10-CM | POA: Diagnosis not present

## 2015-01-02 MED ORDER — AMPHETAMINE-DEXTROAMPHETAMINE 20 MG PO TABS: 20.0000 mg | ORAL_TABLET | Freq: Two times a day (BID) | ORAL | Status: DC

## 2015-01-02 MED ORDER — VICTOZA 18 MG/3ML ~~LOC~~ SOPN: 1.8000 mg | PEN_INJECTOR | Freq: Every day | SUBCUTANEOUS | Status: DC

## 2015-01-02 NOTE — Progress Notes (Signed)
HPI: Rebecca Maxwell is a 39 y.o. female who presents to [place]  today for chief complaint of:  Chief Complaint  Patient presents with  . Follow-up    WEIGHT    WEIGHT CHECK: 10 lb weight loss x 1 month On Victoza as below, not taking Phentermine or Topamax Filed Weights   01/02/15 0821  Weight: 197 lb (89.359 kg)   ADHD: request refill on Adderall, doing well on new dose. No palpitations, no chest pain.     Past medical, social and family history reviewed: Past Medical History  Diagnosis Date  . ADHD (attention deficit hyperactivity disorder)   . GERD (gastroesophageal reflux disease)   . Endometriosis STAGE 3   Past Surgical History  Procedure Laterality Date  . Ankle reconstruction Bilateral 1997  . Cholecystectomy    . Wisdom tooth extraction Bilateral   . Uterine ablation     Social History  Substance Use Topics  . Smoking status: Current Some Day Smoker -- 0.50 packs/day for 10 years    Types: Cigarettes    Last Attempt to Quit: 05/30/2012  . Smokeless tobacco: Never Used     Comment: 1/2 pack every 3 days  . Alcohol Use: No   Family History  Problem Relation Age of Onset  . Diabetes Mother   . Hyperlipidemia Mother   . Hypertension Mother   . Diabetes Sister   . Depression Sister   . Alcohol abuse Father   . Alcohol abuse Maternal Grandmother   . Heart attack Maternal Grandmother   . Stroke Maternal Grandmother   . Cancer Paternal Grandmother     colon    Current Outpatient Prescriptions  Medication Sig Dispense Refill  . amphetamine-dextroamphetamine (ADDERALL) 20 MG tablet Take 1 tablet (20 mg total) by mouth 2 (two) times daily. 60 tablet 0  . hydrocortisone (ANUSOL-HC) 25 MG suppository Place 1 suppository (25 mg total) rectally 2 (two) times daily. (Patient taking differently: Place 25 mg rectally as needed. ) 12 suppository 1  . medroxyPROGESTERone (DEPO-PROVERA) 150 MG/ML injection Inject 1 mL (150 mg  total) into the muscle every 3 (three) months. (Patient taking differently: Inject 150 mg into the muscle every 6 (six) months. ) 1 mL 1  . methocarbamol (ROBAXIN) 500 MG tablet Take 1 tablet (500 mg total) by mouth 3 (three) times daily. 90 tablet 0  . topiramate (TOPAMAX) 50 MG tablet Take 0.5 tablets (25 mg total) by mouth 2 (two) times daily. X 1 week then decrease to 0.5 tablets (25 mg total) once daily x 1 week then stop 60 tablet 3  . VICTOZA 18 MG/3ML SOPN Inject 0.3 mLs (1.8 mg total) into the skin daily. 6 mL 3   No current facility-administered medications for this visit.   Allergies  Allergen Reactions  . Norflex [Orphenadrine]     CAUSES HEADACHES  . Other Other (See Comments)    Oral Pain Medications  . Penicillins Nausea And Vomiting  . Tramadol-Acetaminophen Anxiety      Review of Systems: CONSTITUTIONAL:  No  fever, no chills, No  unintentional weight changes CARDIAC: No chest pain, no pressure/palpitations, no orthopnea RESPIRATORY: No shortness of breath/wheeze GASTROINTESTINAL: No nausea, no vomiting, no abdominal pain PSYCHIATRIC: No concerns with depression, no concerns with anxiety, no sleep problems    Exam:  BP 121/74 mmHg  Pulse 109  Ht 5\' 5"  (1.651 m)  Wt 197 lb (89.359 kg)  BMI 32.78 kg/m2 Constitutional: VSS, see above. General  Appearance: alert, well-developed, well-nourished, NAD Psychiatric: Normal judgment/insight. Normal mood and affect. Oriented x3.    No results found for this or any previous visit (from the past 72 hour(s)).    ASSESSMENT/PLAN:  Obesity - Plan: VICTOZA 18 MG/3ML SOPN  Attention deficit hyperactivity disorder (ADHD), unspecified ADHD type - Plan: amphetamine-dextroamphetamine (ADDERALL) 20 MG tablet    90-day supply of meds given, needs 3 mos followup for weight check, pt states she is going to have labs done at [facility].   Return in about 3 months (around 04/04/2015), or SOONER IF NEED ANYTHING OR ANY CONCERNS.

## 2015-01-03 ENCOUNTER — Ambulatory Visit: Payer: 59 | Admitting: Osteopathic Medicine

## 2015-01-08 ENCOUNTER — Ambulatory Visit: Payer: 59 | Admitting: Physical Therapy

## 2015-01-23 ENCOUNTER — Ambulatory Visit (INDEPENDENT_AMBULATORY_CARE_PROVIDER_SITE_OTHER): Payer: 59 | Admitting: Family Medicine

## 2015-01-23 ENCOUNTER — Encounter: Payer: Self-pay | Admitting: Family Medicine

## 2015-01-23 VITALS — BP 105/61 | HR 91 | Resp 16 | Ht 65.0 in | Wt 197.0 lb

## 2015-01-23 DIAGNOSIS — Z01419 Encounter for gynecological examination (general) (routine) without abnormal findings: Secondary | ICD-10-CM | POA: Diagnosis not present

## 2015-01-23 DIAGNOSIS — N803 Endometriosis of pelvic peritoneum, unspecified: Secondary | ICD-10-CM

## 2015-01-23 DIAGNOSIS — Z1151 Encounter for screening for human papillomavirus (HPV): Secondary | ICD-10-CM

## 2015-01-23 DIAGNOSIS — R635 Abnormal weight gain: Secondary | ICD-10-CM | POA: Diagnosis not present

## 2015-01-23 DIAGNOSIS — M545 Low back pain, unspecified: Secondary | ICD-10-CM

## 2015-01-23 DIAGNOSIS — Z124 Encounter for screening for malignant neoplasm of cervix: Secondary | ICD-10-CM

## 2015-01-23 LAB — COMPREHENSIVE METABOLIC PANEL
ALT: 27 U/L (ref 6–29)
AST: 16 U/L (ref 10–30)
Albumin: 4.3 g/dL (ref 3.6–5.1)
Alkaline Phosphatase: 83 U/L (ref 33–115)
BUN: 9 mg/dL (ref 7–25)
CHLORIDE: 107 mmol/L (ref 98–110)
CO2: 27 mmol/L (ref 20–31)
CREATININE: 0.63 mg/dL (ref 0.50–1.10)
Calcium: 9.4 mg/dL (ref 8.6–10.2)
Glucose, Bld: 84 mg/dL (ref 65–99)
Potassium: 4.2 mmol/L (ref 3.5–5.3)
SODIUM: 139 mmol/L (ref 135–146)
TOTAL PROTEIN: 6.8 g/dL (ref 6.1–8.1)
Total Bilirubin: 0.2 mg/dL (ref 0.2–1.2)

## 2015-01-23 LAB — CBC
HEMATOCRIT: 39.7 % (ref 36.0–46.0)
Hemoglobin: 13.9 g/dL (ref 12.0–15.0)
MCH: 32.4 pg (ref 26.0–34.0)
MCHC: 35 g/dL (ref 30.0–36.0)
MCV: 92.5 fL (ref 78.0–100.0)
MPV: 10.7 fL (ref 8.6–12.4)
Platelets: 287 10*3/uL (ref 150–400)
RBC: 4.29 MIL/uL (ref 3.87–5.11)
RDW: 13.7 % (ref 11.5–15.5)
WBC: 10.6 10*3/uL — AB (ref 4.0–10.5)

## 2015-01-23 LAB — LIPID PANEL
CHOLESTEROL: 155 mg/dL (ref 125–200)
HDL: 47 mg/dL (ref >=46)
LDL CALC: 94 mg/dL (ref <130)
Total CHOL/HDL Ratio: 3.3 Ratio (ref <=5.0)
Triglycerides: 68 mg/dL (ref <150)
VLDL: 14 mg/dL (ref <30)

## 2015-01-23 LAB — TSH: TSH: 1.203 u[IU]/mL (ref 0.350–4.500)

## 2015-01-23 LAB — HEMOGLOBIN A1C
HEMOGLOBIN A1C: 5.4 % (ref <5.7)
MEAN PLASMA GLUCOSE: 108 mg/dL (ref <117)

## 2015-01-23 NOTE — Progress Notes (Signed)
  Subjective:     Rebecca Maxwell is a 39 y.o. female and is here for a comprehensive physical exam. The patient reports problems - low buttock pain on the left side, near pyriformis. On Depo and had some bleeding, so took next shot early..  Social History   Social History  . Marital Status: Married    Spouse Name: N/A  . Number of Children: N/A  . Years of Education: N/A   Occupational History  . [employer]   Social History Main Topics  . Smoking status: Current Some Day Smoker -- 0.50 packs/day for 10 years    Types: Cigarettes    Last Attempt to Quit: 05/30/2012  . Smokeless tobacco: Never Used     Comment: 1/2 pack every 3 days  . Alcohol Use: No  . Drug Use: Not on file  . Sexual Activity: Yes   Other Topics Concern  . Not on file   Social History Narrative   Health Maintenance  Topic Date Due  . HIV Screening  09/14/1992  . INFLUENZA VACCINE  09/16/2015  . PAP SMEAR  01/08/2017  . TETANUS/TDAP  09/18/2023    The following portions of the patient's history were reviewed and updated as appropriate: allergies, current medications, past family history, past medical history, past social history, past surgical history and problem list.  Review of Systems Pertinent items noted in HPI and remainder of comprehensive ROS otherwise negative.   Objective:    BP 105/61 mmHg  Pulse 91  Resp 16  Ht $R'5\' 5"'Lb$  (1.651 m)  Wt 197 lb (89.359 kg)  BMI 32.78 kg/m2 General appearance: alert, cooperative and appears stated age Head: Normocephalic, without obvious abnormality, atraumatic Neck: no JVD and thyroid not enlarged, symmetric, no tenderness/mass/nodules Lungs: clear to auscultation bilaterally Breasts: normal appearance, no masses or tenderness Heart: regular rate and rhythm, S1, S2 normal, no murmur, click, rub or gallop Abdomen: soft, non-tender; bowel sounds normal; no masses,  no organomegaly Pelvic: cervix normal in appearance, external genitalia normal, no  adnexal masses or tenderness, no cervical motion tenderness, uterus normal size, shape, and consistency and vagina normal without discharge Extremities: extremities normal, atraumatic, no cyanosis or edema Pulses: 2+ and symmetric Skin: Skin color, texture, turgor normal. No rashes or lesions Lymph nodes: Cervical, supraclavicular, and axillary nodes normal. Neurologic: Grossly normal    Assessment:    Healthy female exam.      Plan:      Problem List Items Addressed This Visit      Unprioritized   Pelvic peritoneal endometriosis    Continue Depo for now--will consider surgery--may ask GYN/Onc to do her.      Abnormal weight gain    Much improved with Victoza      Low back pain    Likely related to tendonitis vs. Muscle strain.  NSAIDS prn.       Other Visit Diagnoses    Well woman exam with routine gynecological exam    -  Primary    Relevant Orders    Comp Met (CMET) (Completed)    Lipid panel (Completed)    CBC (Completed)    TSH (Completed)    HgB A1c (Completed)    Screening for malignant neoplasm of cervix        Relevant Orders    Cytology - PAP    Encounter for routine gynecological examination           See After Visit Summary for Counseling Recommendations

## 2015-01-23 NOTE — Patient Instructions (Signed)
Preventive Care for Adults, Female A healthy lifestyle and preventive care can promote health and wellness. Preventive health guidelines for women include the following key practices.  A routine yearly physical is a good way to check with your health care provider about your health and preventive screening. It is a chance to share any concerns and updates on your health and to receive a thorough exam.  Visit your dentist for a routine exam and preventive care every 6 months. Brush your teeth twice a day and floss once a day. Good oral hygiene prevents tooth decay and gum disease.  The frequency of eye exams is based on your age, health, family medical history, use of contact lenses, and other factors. Follow your health care provider's recommendations for frequency of eye exams.  Eat a healthy diet. Foods like vegetables, fruits, whole grains, low-fat dairy products, and lean protein foods contain the nutrients you need without too many calories. Decrease your intake of foods high in solid fats, added sugars, and salt. Eat the right amount of calories for you.Get information about a proper diet from your health care provider, if necessary.  Regular physical exercise is one of the most important things you can do for your health. Most adults should get at least 150 minutes of moderate-intensity exercise (any activity that increases your heart rate and causes you to sweat) each week. In addition, most adults need muscle-strengthening exercises on 2 or more days a week.  Maintain a healthy weight. The body mass index (BMI) is a screening tool to identify possible weight problems. It provides an estimate of body fat based on height and weight. Your health care provider can find your BMI and can help you achieve or maintain a healthy weight.For adults 20 years and older:  A BMI below 18.5 is considered underweight.  A BMI of 18.5 to 24.9 is normal.  A BMI of 25 to 29.9 is considered overweight.  A  BMI of 30 and above is considered obese.  Maintain normal blood lipids and cholesterol levels by exercising and minimizing your intake of saturated fat. Eat a balanced diet with plenty of fruit and vegetables. Blood tests for lipids and cholesterol should begin at age 45 and be repeated every 5 years. If your lipid or cholesterol levels are high, you are over 50, or you are at high risk for heart disease, you may need your cholesterol levels checked more frequently.Ongoing high lipid and cholesterol levels should be treated with medicines if diet and exercise are not working.  If you smoke, find out from your health care provider how to quit. If you do not use tobacco, do not start.  Lung cancer screening is recommended for adults aged 45-80 years who are at high risk for developing lung cancer because of a history of smoking. A yearly low-dose CT scan of the lungs is recommended for people who have at least a 30-pack-year history of smoking and are a current smoker or have quit within the past 15 years. A pack year of smoking is smoking an average of 1 pack of cigarettes a day for 1 year (for example: 1 pack a day for 30 years or 2 packs a day for 15 years). Yearly screening should continue until the smoker has stopped smoking for at least 15 years. Yearly screening should be stopped for people who develop a health problem that would prevent them from having lung cancer treatment.  If you are pregnant, do not drink alcohol. If you are  breastfeeding, be very cautious about drinking alcohol. If you are not pregnant and choose to drink alcohol, do not have more than 1 drink per day. One drink is considered to be 12 ounces (355 mL) of beer, 5 ounces (148 mL) of wine, or 1.5 ounces (44 mL) of liquor.  Avoid use of street drugs. Do not share needles with anyone. Ask for help if you need support or instructions about stopping the use of drugs.  High blood pressure causes heart disease and increases the risk  of stroke. Your blood pressure should be checked at least every 1 to 2 years. Ongoing high blood pressure should be treated with medicines if weight loss and exercise do not work.  If you are 55-79 years old, ask your health care provider if you should take aspirin to prevent strokes.  Diabetes screening is done by taking a blood sample to check your blood glucose level after you have not eaten for a certain period of time (fasting). If you are not overweight and you do not have risk factors for diabetes, you should be screened once every 3 years starting at age 45. If you are overweight or obese and you are 40-70 years of age, you should be screened for diabetes every year as part of your cardiovascular risk assessment.  Breast cancer screening is essential preventive care for women. You should practice "breast self-awareness." This means understanding the normal appearance and feel of your breasts and may include breast self-examination. Any changes detected, no matter how small, should be reported to a health care provider. Women in their 20s and 30s should have a clinical breast exam (CBE) by a health care provider as part of a regular health exam every 1 to 3 years. After age 40, women should have a CBE every year. Starting at age 40, women should consider having a mammogram (breast X-ray test) every year. Women who have a family history of breast cancer should talk to their health care provider about genetic screening. Women at a high risk of breast cancer should talk to their health care providers about having an MRI and a mammogram every year.  Breast cancer gene (BRCA)-related cancer risk assessment is recommended for women who have family members with BRCA-related cancers. BRCA-related cancers include breast, ovarian, tubal, and peritoneal cancers. Having family members with these cancers may be associated with an increased risk for harmful changes (mutations) in the breast cancer genes BRCA1 and  BRCA2. Results of the assessment will determine the need for genetic counseling and BRCA1 and BRCA2 testing.  Your health care provider may recommend that you be screened regularly for cancer of the pelvic organs (ovaries, uterus, and vagina). This screening involves a pelvic examination, including checking for microscopic changes to the surface of your cervix (Pap test). You may be encouraged to have this screening done every 3 years, beginning at age 21.  For women ages 30-65, health care providers may recommend pelvic exams and Pap testing every 3 years, or they may recommend the Pap and pelvic exam, combined with testing for human papilloma virus (HPV), every 5 years. Some types of HPV increase your risk of cervical cancer. Testing for HPV may also be done on women of any age with unclear Pap test results.  Other health care providers may not recommend any screening for nonpregnant women who are considered low risk for pelvic cancer and who do not have symptoms. Ask your health care provider if a screening pelvic exam is right for   you.  If you have had past treatment for cervical cancer or a condition that could lead to cancer, you need Pap tests and screening for cancer for at least 20 years after your treatment. If Pap tests have been discontinued, your risk factors (such as having a new sexual partner) need to be reassessed to determine if screening should resume. Some women have medical problems that increase the chance of getting cervical cancer. In these cases, your health care provider may recommend more frequent screening and Pap tests.  Colorectal cancer can be detected and often prevented. Most routine colorectal cancer screening begins at the age of 50 years and continues through age 75 years. However, your health care provider may recommend screening at an earlier age if you have risk factors for colon cancer. On a yearly basis, your health care provider may provide home test kits to check  for hidden blood in the stool. Use of a small camera at the end of a tube, to directly examine the colon (sigmoidoscopy or colonoscopy), can detect the earliest forms of colorectal cancer. Talk to your health care provider about this at age 50, when routine screening begins. Direct exam of the colon should be repeated every 5-10 years through age 75 years, unless early forms of precancerous polyps or small growths are found.  People who are at an increased risk for hepatitis B should be screened for this virus. You are considered at high risk for hepatitis B if:  You were born in a country where hepatitis B occurs often. Talk with your health care provider about which countries are considered high risk.  Your parents were born in a high-risk country and you have not received a shot to protect against hepatitis B (hepatitis B vaccine).  You have HIV or AIDS.  You use needles to inject street drugs.  You live with, or have sex with, someone who has hepatitis B.  You get hemodialysis treatment.  You take certain medicines for conditions like cancer, organ transplantation, and autoimmune conditions.  Hepatitis C blood testing is recommended for all people born from 1945 through 1965 and any individual with known risks for hepatitis C.  Practice safe sex. Use condoms and avoid high-risk sexual practices to reduce the spread of sexually transmitted infections (STIs). STIs include gonorrhea, chlamydia, syphilis, trichomonas, herpes, HPV, and human immunodeficiency virus (HIV). Herpes, HIV, and HPV are viral illnesses that have no cure. They can result in disability, cancer, and death.  You should be screened for sexually transmitted illnesses (STIs) including gonorrhea and chlamydia if:  You are sexually active and are younger than 24 years.  You are older than 24 years and your health care provider tells you that you are at risk for this type of infection.  Your sexual activity has changed  since you were last screened and you are at an increased risk for chlamydia or gonorrhea. Ask your health care provider if you are at risk.  If you are at risk of being infected with HIV, it is recommended that you take a prescription medicine daily to prevent HIV infection. This is called preexposure prophylaxis (PrEP). You are considered at risk if:  You are sexually active and do not regularly use condoms or know the HIV status of your partner(s).  You take drugs by injection.  You are sexually active with a partner who has HIV.  Talk with your health care provider about whether you are at high risk of being infected with HIV. If   you choose to begin PrEP, you should first be tested for HIV. You should then be tested every 3 months for as long as you are taking PrEP.  Osteoporosis is a disease in which the bones lose minerals and strength with aging. This can result in serious bone fractures or breaks. The risk of osteoporosis can be identified using a bone density scan. Women ages 67 years and over and women at risk for fractures or osteoporosis should discuss screening with their health care providers. Ask your health care provider whether you should take a calcium supplement or vitamin D to reduce the rate of osteoporosis.  Menopause can be associated with physical symptoms and risks. Hormone replacement therapy is available to decrease symptoms and risks. You should talk to your health care provider about whether hormone replacement therapy is right for you.  Use sunscreen. Apply sunscreen liberally and repeatedly throughout the day. You should seek shade when your shadow is shorter than you. Protect yourself by wearing long sleeves, pants, a wide-brimmed hat, and sunglasses year round, whenever you are outdoors.  Once a month, do a whole body skin exam, using a mirror to look at the skin on your back. Tell your health care provider of new moles, moles that have irregular borders, moles that  are larger than a pencil eraser, or moles that have changed in shape or color.  Stay current with required vaccines (immunizations).  Influenza vaccine. All adults should be immunized every year.  Tetanus, diphtheria, and acellular pertussis (Td, Tdap) vaccine. Pregnant women should receive 1 dose of Tdap vaccine during each pregnancy. The dose should be obtained regardless of the length of time since the last dose. Immunization is preferred during the 27th-36th week of gestation. An adult who has not previously received Tdap or who does not know her vaccine status should receive 1 dose of Tdap. This initial dose should be followed by tetanus and diphtheria toxoids (Td) booster doses every 10 years. Adults with an unknown or incomplete history of completing a 3-dose immunization series with Td-containing vaccines should begin or complete a primary immunization series including a Tdap dose. Adults should receive a Td booster every 10 years.  Varicella vaccine. An adult without evidence of immunity to varicella should receive 2 doses or a second dose if she has previously received 1 dose. Pregnant females who do not have evidence of immunity should receive the first dose after pregnancy. This first dose should be obtained before leaving the health care facility. The second dose should be obtained 4-8 weeks after the first dose.  Human papillomavirus (HPV) vaccine. Females aged 13-26 years who have not received the vaccine previously should obtain the 3-dose series. The vaccine is not recommended for use in pregnant females. However, pregnancy testing is not needed before receiving a dose. If a female is found to be pregnant after receiving a dose, no treatment is needed. In that case, the remaining doses should be delayed until after the pregnancy. Immunization is recommended for any person with an immunocompromised condition through the age of 61 years if she did not get any or all doses earlier. During the  3-dose series, the second dose should be obtained 4-8 weeks after the first dose. The third dose should be obtained 24 weeks after the first dose and 16 weeks after the second dose.  Zoster vaccine. One dose is recommended for adults aged 30 years or older unless certain conditions are present.  Measles, mumps, and rubella (MMR) vaccine. Adults born  before 1957 generally are considered immune to measles and mumps. Adults born in 1957 or later should have 1 or more doses of MMR vaccine unless there is a contraindication to the vaccine or there is laboratory evidence of immunity to each of the three diseases. A routine second dose of MMR vaccine should be obtained at least 28 days after the first dose for students attending postsecondary schools, health care workers, or international travelers. People who received inactivated measles vaccine or an unknown type of measles vaccine during 1963-1967 should receive 2 doses of MMR vaccine. People who received inactivated mumps vaccine or an unknown type of mumps vaccine before 1979 and are at high risk for mumps infection should consider immunization with 2 doses of MMR vaccine. For females of childbearing age, rubella immunity should be determined. If there is no evidence of immunity, females who are not pregnant should be vaccinated. If there is no evidence of immunity, females who are pregnant should delay immunization until after pregnancy. Unvaccinated health care workers born before 1957 who lack laboratory evidence of measles, mumps, or rubella immunity or laboratory confirmation of disease should consider measles and mumps immunization with 2 doses of MMR vaccine or rubella immunization with 1 dose of MMR vaccine.  Pneumococcal 13-valent conjugate (PCV13) vaccine. When indicated, a person who is uncertain of his immunization history and has no record of immunization should receive the PCV13 vaccine. All adults 65 years of age and older should receive this  vaccine. An adult aged 19 years or older who has certain medical conditions and has not been previously immunized should receive 1 dose of PCV13 vaccine. This PCV13 should be followed with a dose of pneumococcal polysaccharide (PPSV23) vaccine. Adults who are at high risk for pneumococcal disease should obtain the PPSV23 vaccine at least 8 weeks after the dose of PCV13 vaccine. Adults older than 37 years of age who have normal immune system function should obtain the PPSV23 vaccine dose at least 1 year after the dose of PCV13 vaccine.  Pneumococcal polysaccharide (PPSV23) vaccine. When PCV13 is also indicated, PCV13 should be obtained first. All adults aged 65 years and older should be immunized. An adult younger than age 65 years who has certain medical conditions should be immunized. Any person who resides in a nursing home or long-term care facility should be immunized. An adult smoker should be immunized. People with an immunocompromised condition and certain other conditions should receive both PCV13 and PPSV23 vaccines. People with human immunodeficiency virus (HIV) infection should be immunized as soon as possible after diagnosis. Immunization during chemotherapy or radiation therapy should be avoided. Routine use of PPSV23 vaccine is not recommended for American Indians, [place] Natives, or people younger than 65 years unless there are medical conditions that require PPSV23 vaccine. When indicated, people who have unknown immunization and have no record of immunization should receive PPSV23 vaccine. One-time revaccination 5 years after the first dose of PPSV23 is recommended for people aged 19-64 years who have chronic kidney failure, nephrotic syndrome, asplenia, or immunocompromised conditions. People who received 1-2 doses of PPSV23 before age 65 years should receive another dose of PPSV23 vaccine at age 65 years or later if at least 5 years have passed since the previous dose. Doses of PPSV23 are not  needed for people immunized with PPSV23 at or after age 65 years.  Meningococcal vaccine. Adults with asplenia or persistent complement component deficiencies should receive 2 doses of quadrivalent meningococcal conjugate (MenACWY-D) vaccine. The doses should be obtained   at least 2 months apart. Microbiologists working with certain meningococcal bacteria, [place] recruits, people at risk during an outbreak, and people who travel to or live in countries with a high rate of meningitis should be immunized. A first-year college student up through age 34 years who is living in a residence hall should receive a dose if she did not receive a dose on or after her 16th birthday. Adults who have certain high-risk conditions should receive one or more doses of vaccine.  Hepatitis A vaccine. Adults who wish to be protected from this disease, have certain high-risk conditions, work with hepatitis A-infected animals, work in hepatitis A research labs, or travel to or work in countries with a high rate of hepatitis A should be immunized. Adults who were previously unvaccinated and who anticipate close contact with an international adoptee during the first 60 days after arrival in the [place] from a country with a high rate of hepatitis A should be immunized.  Hepatitis B vaccine. Adults who wish to be protected from this disease, have certain high-risk conditions, may be exposed to blood or other infectious body fluids, are household contacts or sex partners of hepatitis B positive people, are clients or workers in certain care facilities, or travel to or work in countries with a high rate of hepatitis B should be immunized.  Haemophilus influenzae type b (Hib) vaccine. A previously unvaccinated person with asplenia or sickle cell disease or having a scheduled splenectomy should receive 1 dose of Hib vaccine. Regardless of previous immunization, a recipient of a hematopoietic stem cell transplant should receive a  3-dose series 6-12 months after her successful transplant. Hib vaccine is not recommended for adults with HIV infection. Preventive Services / Frequency Ages 35 to 4 years  Blood pressure check.** / Every 3-5 years.  Lipid and cholesterol check.** / Every 5 years beginning at age 60.  Clinical breast exam.** / Every 3 years for women in their 71s and 10s.  BRCA-related cancer risk assessment.** / For women who have family members with a BRCA-related cancer (breast, ovarian, tubal, or peritoneal cancers).  Pap test.** / Every 2 years from ages 76 through 26. Every 3 years starting at age 61 through age 76 or 93 with a history of 3 consecutive normal Pap tests.  HPV screening.** / Every 3 years from ages 37 through ages 60 to 51 with a history of 3 consecutive normal Pap tests.  Hepatitis C blood test.** / For any individual with known risks for hepatitis C.  Skin self-exam. / Monthly.  Influenza vaccine. / Every year.  Tetanus, diphtheria, and acellular pertussis (Tdap, Td) vaccine.** / Consult your health care provider. Pregnant women should receive 1 dose of Tdap vaccine during each pregnancy. 1 dose of Td every 10 years.  Varicella vaccine.** / Consult your health care provider. Pregnant females who do not have evidence of immunity should receive the first dose after pregnancy.  HPV vaccine. / 3 doses over 6 months, if 93 and younger. The vaccine is not recommended for use in pregnant females. However, pregnancy testing is not needed before receiving a dose.  Measles, mumps, rubella (MMR) vaccine.** / You need at least 1 dose of MMR if you were born in 1957 or later. You may also need a 2nd dose. For females of childbearing age, rubella immunity should be determined. If there is no evidence of immunity, females who are not pregnant should be vaccinated. If there is no evidence of immunity, females who are  pregnant should delay immunization until after pregnancy.  Pneumococcal  13-valent conjugate (PCV13) vaccine.** / Consult your health care provider.  Pneumococcal polysaccharide (PPSV23) vaccine.** / 1 to 2 doses if you smoke cigarettes or if you have certain conditions.  Meningococcal vaccine.** / 1 dose if you are age 68 to 8 years and a Market researcher living in a residence hall, or have one of several medical conditions, you need to get vaccinated against meningococcal disease. You may also need additional booster doses.  Hepatitis A vaccine.** / Consult your health care provider.  Hepatitis B vaccine.** / Consult your health care provider.  Haemophilus influenzae type b (Hib) vaccine.** / Consult your health care provider. Ages 7 to 53 years  Blood pressure check.** / Every year.  Lipid and cholesterol check.** / Every 5 years beginning at age 25 years.  Lung cancer screening. / Every year if you are aged 11-80 years and have a 30-pack-year history of smoking and currently smoke or have quit within the past 15 years. Yearly screening is stopped once you have quit smoking for at least 15 years or develop a health problem that would prevent you from having lung cancer treatment.  Clinical breast exam.** / Every year after age 48 years.  BRCA-related cancer risk assessment.** / For women who have family members with a BRCA-related cancer (breast, ovarian, tubal, or peritoneal cancers).  Mammogram.** / Every year beginning at age 41 years and continuing for as long as you are in good health. Consult with your health care provider.  Pap test.** / Every 3 years starting at age 65 years through age 37 or 70 years with a history of 3 consecutive normal Pap tests.  HPV screening.** / Every 3 years from ages 72 years through ages 60 to 40 years with a history of 3 consecutive normal Pap tests.  Fecal occult blood test (FOBT) of stool. / Every year beginning at age 21 years and continuing until age 5 years. You may not need to do this test if you get  a colonoscopy every 10 years.  Flexible sigmoidoscopy or colonoscopy.** / Every 5 years for a flexible sigmoidoscopy or every 10 years for a colonoscopy beginning at age 35 years and continuing until age 48 years.  Hepatitis C blood test.** / For all people born from [birth year] through 1965 and any individual with known risks for hepatitis C.  Skin self-exam. / Monthly.  Influenza vaccine. / Every year.  Tetanus, diphtheria, and acellular pertussis (Tdap/Td) vaccine.** / Consult your health care provider. Pregnant women should receive 1 dose of Tdap vaccine during each pregnancy. 1 dose of Td every 10 years.  Varicella vaccine.** / Consult your health care provider. Pregnant females who do not have evidence of immunity should receive the first dose after pregnancy.  Zoster vaccine.** / 1 dose for adults aged 30 years or older.  Measles, mumps, rubella (MMR) vaccine.** / You need at least 1 dose of MMR if you were born in 1957 or later. You may also need a second dose. For females of childbearing age, rubella immunity should be determined. If there is no evidence of immunity, females who are not pregnant should be vaccinated. If there is no evidence of immunity, females who are pregnant should delay immunization until after pregnancy.  Pneumococcal 13-valent conjugate (PCV13) vaccine.** / Consult your health care provider.  Pneumococcal polysaccharide (PPSV23) vaccine.** / 1 to 2 doses if you smoke cigarettes or if you have certain conditions.  Meningococcal vaccine.** /  Consult your health care provider.  Hepatitis A vaccine.** / Consult your health care provider.  Hepatitis B vaccine.** / Consult your health care provider.  Haemophilus influenzae type b (Hib) vaccine.** / Consult your health care provider. Ages 24 years and over  Blood pressure check.** / Every year.  Lipid and cholesterol check.** / Every 5 years beginning at age 22 years.  Lung cancer screening. / Every year if you  are aged 63-80 years and have a 30-pack-year history of smoking and currently smoke or have quit within the past 15 years. Yearly screening is stopped once you have quit smoking for at least 15 years or develop a health problem that would prevent you from having lung cancer treatment.  Clinical breast exam.** / Every year after age 9 years.  BRCA-related cancer risk assessment.** / For women who have family members with a BRCA-related cancer (breast, ovarian, tubal, or peritoneal cancers).  Mammogram.** / Every year beginning at age 67 years and continuing for as long as you are in good health. Consult with your health care provider.  Pap test.** / Every 3 years starting at age 78 years through age 90 or 82 years with 3 consecutive normal Pap tests. Testing can be stopped between 65 and 70 years with 3 consecutive normal Pap tests and no abnormal Pap or HPV tests in the past 10 years.  HPV screening.** / Every 3 years from ages 21 years through ages 26 or 38 years with a history of 3 consecutive normal Pap tests. Testing can be stopped between 65 and 70 years with 3 consecutive normal Pap tests and no abnormal Pap or HPV tests in the past 10 years.  Fecal occult blood test (FOBT) of stool. / Every year beginning at age 62 years and continuing until age 65 years. You may not need to do this test if you get a colonoscopy every 10 years.  Flexible sigmoidoscopy or colonoscopy.** / Every 5 years for a flexible sigmoidoscopy or every 10 years for a colonoscopy beginning at age 27 years and continuing until age 66 years.  Hepatitis C blood test.** / For all people born from [birth year] through 1963 and any individual with known risks for hepatitis C.  Osteoporosis screening.** / A one-time screening for women ages 43 years and over and women at risk for fractures or osteoporosis.  Skin self-exam. / Monthly.  Influenza vaccine. / Every year.  Tetanus, diphtheria, and acellular pertussis (Tdap/Td)  vaccine.** / 1 dose of Td every 10 years.  Varicella vaccine.** / Consult your health care provider.  Zoster vaccine.** / 1 dose for adults aged 48 years or older.  Pneumococcal 13-valent conjugate (PCV13) vaccine.** / Consult your health care provider.  Pneumococcal polysaccharide (PPSV23) vaccine.** / 1 dose for all adults aged 37 years and older.  Meningococcal vaccine.** / Consult your health care provider.  Hepatitis A vaccine.** / Consult your health care provider.  Hepatitis B vaccine.** / Consult your health care provider.  Haemophilus influenzae type b (Hib) vaccine.** / Consult your health care provider. ** Family history and personal history of risk and conditions may change your health care provider's recommendations.   This information is not intended to replace advice given to you by your health care provider. Make sure you discuss any questions you have with your health care provider.   Document Released: 03/30/2001 Document Revised: 02/22/2014 Document Reviewed: 06/29/2010 Elsevier Interactive Patient Education [employer]. Endometriosis Endometriosis is a condition in which the tissue that lines the  uterus (endometrium) grows outside of its normal location. The tissue may grow in many locations close to the uterus, but it commonly grows on the ovaries, fallopian tubes, vagina, or bowel. Because the uterus expels, or sheds, its lining every menstrual cycle, there is bleeding wherever the endometrial tissue is located. This can cause pain because blood is irritating to tissues not normally exposed to it.  CAUSES  The cause of endometriosis is not known.  SIGNS AND SYMPTOMS  Often, there are no symptoms. When symptoms are present, they can vary with the location of the displaced tissue. Various symptoms can occur at different times. Although symptoms occur mainly during a woman's menstrual period, they can also occur midcycle and usually stop with menopause. Some  people may go months with no symptoms at all. Symptoms may include:   Back or abdominal pain.   Heavier bleeding during periods.   Pain during intercourse.   Painful bowel movements.   Infertility. DIAGNOSIS  Your health care provider will do a physical exam and ask about your symptoms. Various tests may be done, such as:   Blood tests and urine tests. These are done to help rule out other problems.   Ultrasound. This test is done to look for abnormal tissue.   An X-ray of the lower bowel (barium enema).  Laparoscopy. In this procedure, a thin, lighted tube with a tiny camera on the end (laparoscope) is inserted into your abdomen. This helps your health care provider look for abnormal tissue to confirm the diagnosis. The health care provider may also remove a small piece of tissue (biopsy) from any abnormal tissue found. This tissue sample can then be sent to a lab so it can be looked at under a microscope. TREATMENT  Treatment will vary and may include:   Medicines to relieve pain. Nonsteroidal anti-inflammatory drugs (NSAIDs) are a type of pain medicine that can help to relieve the pain caused by endometriosis.  Hormonal therapy. When using hormonal therapy, periods are eliminated. This eliminates the monthly exposure to blood by the displaced endometrial tissue.   Surgery. Surgery may sometimes be done to remove the abnormal endometrial tissue. In severe cases, surgery may be done to remove the fallopian tubes, uterus, and ovaries (hysterectomy). HOME CARE INSTRUCTIONS   Take all medicines as directed by your health care provider. Do not take aspirin because it may increase bleeding when you are not on hormonal therapy.   Avoid activities that produce pain, including sexual activity. SEEK MEDICAL CARE IF:  You have pelvic pain before, after, or during your periods.  You have pelvic pain between periods that gets worse during your period.  You have pelvic pain  during or after sex.  You have pelvic pain with bowel movements or urination, especially during your period.  You have problems getting pregnant.  You have a fever. SEEK IMMEDIATE MEDICAL CARE IF:   Your pain is severe and is not responding to pain medicine.   You have severe nausea and vomiting, or you cannot keep foods down.   You have pain that is limited to the right lower part of your abdomen.   You have swelling or increasing pain in your abdomen.   You see blood in your stool.  MAKE SURE YOU:   Understand these instructions.  Will watch your condition.  Will get help right away if you are not doing well or get worse.   This information is not intended to replace advice given to you by your  health care provider. Make sure you discuss any questions you have with your health care provider.   Document Released: 01/30/2000 Document Revised: 02/22/2014 Document Reviewed: 09/29/2012 Elsevier Interactive Patient Education [employer].

## 2015-01-24 NOTE — Assessment & Plan Note (Signed)
Much improved with Victoza

## 2015-01-24 NOTE — Assessment & Plan Note (Signed)
Continue Depo for now--will consider surgery--may ask GYN/Onc to do her.

## 2015-01-24 NOTE — Assessment & Plan Note (Signed)
Likely related to tendonitis vs. Muscle strain.  NSAIDS prn.

## 2015-01-27 LAB — CYTOLOGY - PAP

## 2015-02-20 ENCOUNTER — Other Ambulatory Visit: Payer: Self-pay | Admitting: Family Medicine

## 2015-02-20 DIAGNOSIS — K5901 Slow transit constipation: Secondary | ICD-10-CM

## 2015-02-20 MED ORDER — BISACODYL 10 MG RE SUPP: 10.0000 mg | RECTAL | Status: DC | PRN

## 2015-02-21 MED FILL — BISCOLAX 10 MG SUPPOSITORY: 10 | 12 days supply | Qty: 12 | Fill #0

## 2015-03-01 DIAGNOSIS — M5136 Other intervertebral disc degeneration, lumbar region: Secondary | ICD-10-CM | POA: Diagnosis not present

## 2015-03-01 DIAGNOSIS — G5702 Lesion of sciatic nerve, left lower limb: Secondary | ICD-10-CM | POA: Diagnosis not present

## 2015-03-01 DIAGNOSIS — M4806 Spinal stenosis, lumbar region: Secondary | ICD-10-CM | POA: Diagnosis not present

## 2015-03-01 DIAGNOSIS — M5416 Radiculopathy, lumbar region: Secondary | ICD-10-CM | POA: Diagnosis not present

## 2015-03-01 DIAGNOSIS — M791 Myalgia: Secondary | ICD-10-CM | POA: Diagnosis not present

## 2015-03-04 ENCOUNTER — Encounter: Payer: Self-pay | Admitting: Osteopathic Medicine

## 2015-03-04 DIAGNOSIS — G5700 Lesion of sciatic nerve, unspecified lower limb: Secondary | ICD-10-CM | POA: Insufficient documentation

## 2015-03-04 DIAGNOSIS — M48061 Spinal stenosis, lumbar region without neurogenic claudication: Secondary | ICD-10-CM

## 2015-03-04 HISTORY — DX: Spinal stenosis, lumbar region without neurogenic claudication: M48.061

## 2015-03-11 ENCOUNTER — Telehealth: Payer: Self-pay | Admitting: *Deleted

## 2015-03-11 DIAGNOSIS — K648 Other hemorrhoids: Secondary | ICD-10-CM

## 2015-03-11 MED ORDER — HYDROCORTISONE ACE-PRAMOXINE 1-1 % RE FOAM: 1.0000 | Freq: Two times a day (BID) | RECTAL | Status: DC

## 2015-03-11 NOTE — Telephone Encounter (Signed)
RX for Procto foam 1% sent to [facility] per VO Dr Gala Romney.

## 2015-03-18 DIAGNOSIS — M791 Myalgia: Secondary | ICD-10-CM | POA: Diagnosis not present

## 2015-03-18 DIAGNOSIS — M5416 Radiculopathy, lumbar region: Secondary | ICD-10-CM | POA: Diagnosis not present

## 2015-03-24 ENCOUNTER — Telehealth: Payer: Self-pay | Admitting: *Deleted

## 2015-03-24 DIAGNOSIS — K649 Unspecified hemorrhoids: Secondary | ICD-10-CM

## 2015-03-24 MED ORDER — HYDROCORTISONE 2.5 % RE CREA: 1.0000 "application " | TOPICAL_CREAM | Freq: Two times a day (BID) | RECTAL | Status: DC

## 2015-03-24 MED FILL — HYDROCORTISONE 2.5% CREAM: 2.5 | 20 days supply | Qty: 30 | Fill #0

## 2015-03-24 NOTE — Telephone Encounter (Signed)
Per Dr Lillette Boxer rectal cream sent to out pt pharmacy

## 2015-03-27 MED FILL — VICTOZA 18 MG/3 ML INJECT P: 18 | 30 days supply | Qty: 9 | Fill #1

## 2015-04-02 ENCOUNTER — Other Ambulatory Visit: Payer: Self-pay | Admitting: *Deleted

## 2015-04-02 DIAGNOSIS — N809 Endometriosis, unspecified: Secondary | ICD-10-CM

## 2015-04-02 MED ORDER — MEDROXYPROGESTERONE ACETATE 150 MG/ML IM SUSP: 150.0000 mg | INTRAMUSCULAR | Status: DC

## 2015-04-02 MED FILL — MEDROXYPROG 150 MG/ML SYR: 150 | 84 days supply | Qty: 1 | Fill #0

## 2015-04-02 NOTE — Telephone Encounter (Signed)
RF sent to [facility] for Depo Provera 150 mg

## 2015-04-03 MED FILL — ALPRAZolam 0.5 MG TABS: 0.5 | 1 days supply | Qty: 1 | Fill #0

## 2015-04-03 MED FILL — miSOPROStol 200 MCG TABS: 200 | 2 days supply | Qty: 4 | Fill #0

## 2015-04-06 ENCOUNTER — Other Ambulatory Visit: Payer: Self-pay | Admitting: Osteopathic Medicine

## 2015-04-06 DIAGNOSIS — F909 Attention-deficit hyperactivity disorder, unspecified type: Secondary | ICD-10-CM

## 2015-04-07 MED ORDER — AMPHETAMINE-DEXTROAMPHETAMINE 20 MG PO TABS: 20.0000 mg | ORAL_TABLET | Freq: Two times a day (BID) | ORAL | Status: DC

## 2015-04-07 NOTE — Telephone Encounter (Signed)
Patient needs a follow up appointment for further refills. Rhonda Cunningham,CMA

## 2015-04-10 ENCOUNTER — Ambulatory Visit (INDEPENDENT_AMBULATORY_CARE_PROVIDER_SITE_OTHER): Payer: 59 | Admitting: Family Medicine

## 2015-04-10 ENCOUNTER — Encounter: Payer: Self-pay | Admitting: *Deleted

## 2015-04-10 ENCOUNTER — Encounter: Payer: Self-pay | Admitting: Family Medicine

## 2015-04-10 VITALS — BP 110/73 | HR 108 | Resp 18 | Ht 65.0 in | Wt 193.0 lb

## 2015-04-10 DIAGNOSIS — Z30430 Encounter for insertion of intrauterine contraceptive device: Secondary | ICD-10-CM

## 2015-04-10 DIAGNOSIS — N803 Endometriosis of pelvic peritoneum, unspecified: Secondary | ICD-10-CM

## 2015-04-10 MED ORDER — LEVONORGESTREL 20 MCG/24HR IU IUD: 1.0000 | INTRAUTERINE_SYSTEM | Freq: Once | INTRAUTERINE | Status: DC

## 2015-04-10 MED ORDER — LEVONORGESTREL 20 MCG/24HR IU IUD
1.0000 | INTRAUTERINE_SYSTEM | Freq: Once | INTRAUTERINE | Status: AC
  Administered 2015-04-10: 1 via INTRAUTERINE

## 2015-04-10 NOTE — Assessment & Plan Note (Deleted)
Trial of Mirena to see if this controls pain and bleeding. May stop Depo.

## 2015-04-10 NOTE — Progress Notes (Signed)
    Subjective:    Patient ID: Rebecca Maxwell is a 40 y.o. female presenting with IUD Insertion  on 04/10/2015  HPI: Here for IUD placement.  She has long h/o endometriosis and pain. S/p endometrial ablation, but bleeding has resumed. This has led to pain.  Is on Depo, but would like to try something different. Took cytotec this am.  Review of Systems  Constitutional: Negative for fever and chills.  Respiratory: Negative for shortness of breath.   Cardiovascular: Negative for chest pain.  Gastrointestinal: Negative for nausea, vomiting and abdominal pain.  Genitourinary: Negative for dysuria.  Skin: Negative for rash.      Objective:    BP 110/73 mmHg  Pulse 108  Resp 18  Ht 5\' 5"  (1.651 m)  Wt 193 lb (87.544 kg)  BMI 32.12 kg/m2 Physical Exam  Constitutional: She is oriented to person, place, and time. She appears well-developed and well-nourished. No distress.  HENT:  Head: Normocephalic and atraumatic.  Eyes: No scleral icterus.  Cardiovascular: Normal rate.   Abdominal: Soft.  Neurological: She is alert and oriented to person, place, and time.  Psychiatric: She has a normal mood and affect.   Patient identified, informed consent performed, signed copy in chart, time out was performed.  Urine pregnancy test negative. Procedure: Speculum placed in the vagina.  Cervix visualized.  Cleaned with Betadine x 2.  Grasped anteriourly with a single tooth tenaculum.  Os finder used. Uterus sounded to 6.  Mirena IUD placed per manufacturer's recommendations.  Strings trimmed to 3 cm.   Patient given post procedure instructions and Mirena care card with expiration date.  Patient is asked to check IUD strings periodically and follow up in 4-6 weeks for IUD check.      Assessment & Plan:  1. Encounter for insertion of mirena IUD  - levonorgestrel (MIRENA) 20 MCG/24HR IUD 1 each; 1 Intra Uterine Device (1 each total) by Intrauterine route once.  2. Pelvic peritoneal  endometriosis Trial of Mirena to see if this controls pain and bleeding. May stop Depo.   Return in about 3 months (around 07/08/2015) for a follow-up.  Kaleth Koy S 04/10/2015 1:33 PM

## 2015-04-29 ENCOUNTER — Ambulatory Visit: Payer: 59 | Admitting: Osteopathic Medicine

## 2015-04-30 ENCOUNTER — Ambulatory Visit (INDEPENDENT_AMBULATORY_CARE_PROVIDER_SITE_OTHER): Payer: 59 | Admitting: Osteopathic Medicine

## 2015-04-30 DIAGNOSIS — Z5329 Procedure and treatment not carried out because of patient's decision for other reasons: Secondary | ICD-10-CM

## 2015-04-30 NOTE — Progress Notes (Signed)
NO SHOW

## 2015-05-03 DIAGNOSIS — M791 Myalgia: Secondary | ICD-10-CM | POA: Diagnosis not present

## 2015-05-03 DIAGNOSIS — M533 Sacrococcygeal disorders, not elsewhere classified: Secondary | ICD-10-CM | POA: Diagnosis not present

## 2015-05-03 DIAGNOSIS — M5416 Radiculopathy, lumbar region: Secondary | ICD-10-CM | POA: Diagnosis not present

## 2015-05-03 DIAGNOSIS — G5702 Lesion of sciatic nerve, left lower limb: Secondary | ICD-10-CM | POA: Diagnosis not present

## 2015-05-07 DIAGNOSIS — M533 Sacrococcygeal disorders, not elsewhere classified: Secondary | ICD-10-CM | POA: Diagnosis not present

## 2015-05-07 DIAGNOSIS — M1612 Unilateral primary osteoarthritis, left hip: Secondary | ICD-10-CM | POA: Diagnosis not present

## 2015-05-12 ENCOUNTER — Ambulatory Visit (INDEPENDENT_AMBULATORY_CARE_PROVIDER_SITE_OTHER): Payer: 59 | Admitting: Osteopathic Medicine

## 2015-05-12 ENCOUNTER — Encounter: Payer: Self-pay | Admitting: Osteopathic Medicine

## 2015-05-12 VITALS — BP 105/69 | HR 96 | Wt 195.0 lb

## 2015-05-12 DIAGNOSIS — Z975 Presence of (intrauterine) contraceptive device: Secondary | ICD-10-CM

## 2015-05-12 DIAGNOSIS — E669 Obesity, unspecified: Secondary | ICD-10-CM

## 2015-05-12 DIAGNOSIS — F909 Attention-deficit hyperactivity disorder, unspecified type: Secondary | ICD-10-CM

## 2015-05-12 HISTORY — DX: Presence of (intrauterine) contraceptive device: Z97.5

## 2015-05-12 MED ORDER — AMPHETAMINE-DEXTROAMPHETAMINE 20 MG PO TABS: 20.0000 mg | ORAL_TABLET | Freq: Two times a day (BID) | ORAL | Status: DC

## 2015-05-12 MED ORDER — VICTOZA 18 MG/3ML ~~LOC~~ SOPN: 1.8000 mg | PEN_INJECTOR | Freq: Every day | SUBCUTANEOUS | Status: DC

## 2015-05-12 MED FILL — VICTOZA 18 MG/3 ML INJECT P: 18 | 30 days supply | Qty: 9 | Fill #0

## 2015-05-12 MED FILL — AMPHETAMINE SALTS 20 MG TAB: 20 | 90 days supply | Qty: 180 | Fill #0

## 2015-05-12 MED FILL — ULTICARE PEN NDL 8MM 31G: 31G X 8 MM | 90 days supply | Qty: 100 | Fill #1

## 2015-05-12 NOTE — Progress Notes (Signed)
HPI Rebecca Maxwell is a 40 y.o. female who presents to [place]  today for chief complaint of:  Chief Complaint  Patient presents with  . f/u adhd    ADHD - Adderall was lowered to 15 mg when started the phentermine from 20 mg, we increased dose Adderall to 20 mg bid since she was no longer on phentermine. Plans to continue this dose.   WEIGHT/OBESITY -  Weight loss reports being more successful on Victoza. Needs refill on Victoza. On Mirena for contraception.    Filed Vitals:   05/12/15 0918  Weight: 195 lb (88.451 kg)  10/31/14 Wt 212  Last visit: 197 01/02/15  Past medical, social and family history reviewed: Past Medical History  Diagnosis Date  . ADHD (attention deficit hyperactivity disorder)   . GERD (gastroesophageal reflux disease)   . Endometriosis STAGE 3  . Spinal stenosis of lumbar region 03/04/2015    Per ortho records, L4L5 MRI 11/25/14 but asymptomatic    Past Surgical History  Procedure Laterality Date  . Ankle reconstruction Bilateral 1997  . Cholecystectomy    . Wisdom tooth extraction Bilateral   . Uterine ablation     Social History  Substance Use Topics  . Smoking status: Current Some Day Smoker -- 0.50 packs/day for 10 years    Types: Cigarettes    Last Attempt to Quit: 05/30/2012  . Smokeless tobacco: Never Used     Comment: 1/2 pack every 3 days  . Alcohol Use: No   Family History  Problem Relation Age of Onset  . Diabetes Mother   . Hyperlipidemia Mother   . Hypertension Mother   . Diabetes Sister   . Depression Sister   . Alcohol abuse Father   . Alcohol abuse Maternal Grandmother   . Heart attack Maternal Grandmother   . Stroke Maternal Grandmother   . Cancer Paternal Grandmother     colon    Current Outpatient Prescriptions  Medication Sig Dispense Refill  . amphetamine-dextroamphetamine (ADDERALL) 20 MG tablet Take 1 tablet (20 mg total) by mouth 2 (two) times daily. FOLLOW UP APPOINTMENT  NEEDED FOR FURTHER REFILLS 90 tablet 0  . bisacodyl (DULCOLAX) 10 MG suppository Place 1 suppository (10 mg total) rectally as needed for moderate constipation. 12 suppository 0  . hydrocortisone (ANUSOL-HC) 2.5 % rectal cream Place 1 application rectally 2 (two) times daily. 30 g 0  . hydrocortisone (ANUSOL-HC) 25 MG suppository Place 1 suppository (25 mg total) rectally 2 (two) times daily. (Patient taking differently: Place 25 mg rectally as needed. ) 12 suppository 1  . levonorgestrel (MIRENA) 20 MCG/24HR IUD 1 each by Intrauterine route once.    Marland Kitchen VICTOZA 18 MG/3ML SOPN Inject 0.3 mLs (1.8 mg total) into the skin daily. 6 mL 3   No current facility-administered medications for this visit.   Allergies  Allergen Reactions  . Norflex [Orphenadrine]     CAUSES HEADACHES  . Other Other (See Comments)    Oral Pain Medications  . Penicillins Nausea And Vomiting  . Tramadol-Acetaminophen Anxiety      Review of Systems: CONSTITUTIONAL: Neg fever/chills, obesity as per HPI CARDIAC: No chest pain/pressure/palpitations, no orthopnea RESPIRATORY: No cough/shortness of breath/wheeze MUSCULOSKELETAL: (+) chronic back myalgia/arthralgia, following with [place] [facility] ENDOCRINE: No polyuria/polydipsia/polyphagia, no heat/cold intolerance     Exam:  BP 105/69 mmHg  Pulse 96  Wt 195 lb (88.451 kg) Constitutional: VSS, see above. General Appearance: alert, well-developed, well-nourished, NAD Eyes: Normal lids and conjunctive,  non-icteric sclera,  Ears, Nose, Mouth, Throat: Normal external inspection ears/nares/mouth/lips/gums, Respiratory: Normal respiratory effort. no wheeze/rhonchi/rales Cardiovascular: S1/S2 normal, no murmur/rub/gallop auscultated. RRR. nlower extremity edema. Gastrointestinal: Nontender, no masses. No hepatomegaly, no splenomegaly. No hernia appreciated. Bowel sounds normal. Rectal exam deferred.  Musculoskeletal: Gait normal. No clubbing/cyanosis of digits.  Neurological:  No cranial nerve deficit on limited exam. Motor and sensation intact and symmetric Psychiatric: Normal judgment/insight. Normal mood and affect. Oriented x3.    No results found for this or any previous visit (from the past 72 hour(s)).    ASSESSMENT/PLAN:  Attention deficit hyperactivity disorder (ADHD), unspecified ADHD type - Plan: amphetamine-dextroamphetamine (ADDERALL) 20 MG tablet  Obesity - Plan: VICTOZA 18 MG/3ML SOPN  RTC 3 months -  Weight check and Adderall refill.

## 2015-06-19 ENCOUNTER — Other Ambulatory Visit: Payer: Self-pay | Admitting: Family Medicine

## 2015-06-19 DIAGNOSIS — K5901 Slow transit constipation: Secondary | ICD-10-CM

## 2015-06-19 MED ORDER — BISACODYL 10 MG RE SUPP: 10.0000 mg | RECTAL | Status: DC | PRN

## 2015-06-19 MED FILL — BISCOLAX 10 MG SUPPOSITORY: 10 | 12 days supply | Qty: 12 | Fill #0

## 2015-06-20 MED FILL — VICTOZA 18 MG/3 ML INJECT P: 18 | 30 days supply | Qty: 9 | Fill #1

## 2015-07-08 ENCOUNTER — Other Ambulatory Visit: Payer: Self-pay | Admitting: Obstetrics & Gynecology

## 2015-07-08 DIAGNOSIS — J069 Acute upper respiratory infection, unspecified: Secondary | ICD-10-CM

## 2015-07-08 DIAGNOSIS — R05 Cough: Secondary | ICD-10-CM

## 2015-07-08 DIAGNOSIS — R053 Chronic cough: Secondary | ICD-10-CM

## 2015-07-08 MED ORDER — HYDROCODONE-HOMATROPINE 5-1.5 MG/5ML PO SYRP: 5.0000 mL | ORAL_SOLUTION | Freq: Four times a day (QID) | ORAL | Status: DC | PRN

## 2015-07-08 MED ORDER — AZITHROMYCIN 250 MG PO TABS: ORAL_TABLET | ORAL | Status: DC

## 2015-07-08 MED FILL — HYDROCODONE-HOMATROPINE SYR: 5-1.5 | 6 days supply | Qty: 120 | Fill #0

## 2015-07-08 MED FILL — AZITHROMYCIN 250 MG TABLET: 250 | 5 days supply | Qty: 6 | Fill #0

## 2015-07-10 DIAGNOSIS — M5417 Radiculopathy, lumbosacral region: Secondary | ICD-10-CM | POA: Diagnosis not present

## 2015-07-10 DIAGNOSIS — M9902 Segmental and somatic dysfunction of thoracic region: Secondary | ICD-10-CM | POA: Diagnosis not present

## 2015-07-10 DIAGNOSIS — M5127 Other intervertebral disc displacement, lumbosacral region: Secondary | ICD-10-CM | POA: Diagnosis not present

## 2015-07-10 DIAGNOSIS — M9905 Segmental and somatic dysfunction of pelvic region: Secondary | ICD-10-CM | POA: Diagnosis not present

## 2015-07-10 DIAGNOSIS — M62830 Muscle spasm of back: Secondary | ICD-10-CM | POA: Diagnosis not present

## 2015-07-10 DIAGNOSIS — M9903 Segmental and somatic dysfunction of lumbar region: Secondary | ICD-10-CM | POA: Diagnosis not present

## 2015-08-01 ENCOUNTER — Other Ambulatory Visit: Payer: Self-pay | Admitting: Family Medicine

## 2015-08-01 DIAGNOSIS — F909 Attention-deficit hyperactivity disorder, unspecified type: Secondary | ICD-10-CM

## 2015-08-01 MED ORDER — AMPHETAMINE-DEXTROAMPHETAMINE 20 MG PO TABS: 20.0000 mg | ORAL_TABLET | Freq: Two times a day (BID) | ORAL | Status: DC

## 2015-08-08 MED FILL — DEXTROAMP-AMPHETAMIN 20 MG: 20 | 90 days supply | Qty: 180 | Fill #0

## 2015-08-20 ENCOUNTER — Telehealth: Payer: Self-pay | Admitting: *Deleted

## 2015-08-20 DIAGNOSIS — W57XXXA Bitten or stung by nonvenomous insect and other nonvenomous arthropods, initial encounter: Secondary | ICD-10-CM

## 2015-08-20 MED ORDER — DOXYCYCLINE HYCLATE 100 MG PO CAPS: 200.0000 mg | ORAL_CAPSULE | Freq: Once | ORAL | Status: DC

## 2015-08-20 MED FILL — DOXYCYCLINE HYC 100 MG CAP: 100 | 1 days supply | Qty: 2 | Fill #0

## 2015-08-20 NOTE — Telephone Encounter (Signed)
Pt had tick bite noted on right hip, Dr Kennon Rounds ordered preventative Doxycylcine for pt to take. Sent rx to pharmacy.

## 2015-09-29 ENCOUNTER — Telehealth: Payer: Self-pay

## 2015-09-29 DIAGNOSIS — M545 Low back pain, unspecified: Secondary | ICD-10-CM

## 2015-09-29 MED ORDER — PREDNISONE 50 MG PO TABS: ORAL_TABLET | ORAL | 0 refills | Status: DC

## 2015-09-29 MED FILL — predniSONE 50 MG TABS: 50 | 5 days supply | Qty: 5 | Fill #0

## 2015-09-29 NOTE — Telephone Encounter (Signed)
Pt is having back pain

## 2015-11-04 ENCOUNTER — Other Ambulatory Visit: Payer: Self-pay | Admitting: Family Medicine

## 2015-11-04 DIAGNOSIS — F909 Attention-deficit hyperactivity disorder, unspecified type: Secondary | ICD-10-CM

## 2015-11-04 MED ORDER — AMPHETAMINE-DEXTROAMPHETAMINE 20 MG PO TABS: 20.0000 mg | ORAL_TABLET | Freq: Two times a day (BID) | ORAL | 0 refills | Status: DC

## 2015-11-04 MED FILL — DEXTROAMP-AMPHETAMIN 20 MG: 20 | 90 days supply | Qty: 180 | Fill #0

## 2015-11-13 ENCOUNTER — Encounter: Payer: Self-pay | Admitting: Family Medicine

## 2015-11-13 ENCOUNTER — Ambulatory Visit (INDEPENDENT_AMBULATORY_CARE_PROVIDER_SITE_OTHER): Payer: 59 | Admitting: Family Medicine

## 2015-11-13 VITALS — BP 108/62 | HR 99 | Ht 65.0 in | Wt 198.0 lb

## 2015-11-13 DIAGNOSIS — M62830 Muscle spasm of back: Secondary | ICD-10-CM | POA: Diagnosis not present

## 2015-11-13 DIAGNOSIS — M9905 Segmental and somatic dysfunction of pelvic region: Secondary | ICD-10-CM

## 2015-11-13 DIAGNOSIS — M9903 Segmental and somatic dysfunction of lumbar region: Secondary | ICD-10-CM

## 2015-11-13 NOTE — Progress Notes (Signed)
   Subjective:    Patient ID: Rebecca Maxwell, female    DOB: [birth year], 40 y.o.   MRN: [id number]  HPI Patient seen for back tightness that started a couple weeks ago and has been increasing in intensity. Has pain on right side that radiates down right leg to back of mid thigh. Has been lifting heavy files at work, which makes pain worse. Has long-standing history of back pain with flares has had facet joint injections in the past.    Has had right ankle fusion.  Review of Systems     Objective:   Physical Exam  Constitutional: She is oriented to person, place, and time. She appears well-developed and well-nourished.  Abdominal: Soft. There is no tenderness. There is no rebound and no guarding.  Musculoskeletal: Normal range of motion.       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 L5 complex segment rotated right L4 flexor and rotated left.  Neurological: She is alert and oriented to person, place, and time.  Skin: Skin is warm and dry.  Psychiatric: She has a normal mood and affect. Her behavior is normal. Judgment and thought content normal.      Assessment & Plan:  1. Back spasm 2. Somatic dysfunction of lumbar region 3. Somatic dysfunction of pelvis region Pt has robaxin, NSAIDs. Continue stretches. Would like referral to Sports medicine for continued manipulation. OMT done - patient tolerated well, no complications.

## 2015-11-25 ENCOUNTER — Encounter: Payer: Self-pay | Admitting: Family Medicine

## 2015-12-02 ENCOUNTER — Other Ambulatory Visit: Payer: Self-pay | Admitting: Family Medicine

## 2015-12-02 MED ORDER — CYCLOBENZAPRINE HCL 10 MG PO TABS: 10.0000 mg | ORAL_TABLET | Freq: Three times a day (TID) | ORAL | 1 refills | Status: DC | PRN

## 2015-12-02 MED FILL — CYCLOBENZAPRINE 10 MG TAB: 10 | 10 days supply | Qty: 30 | Fill #0

## 2015-12-04 MED FILL — SSD 1% CREAM: 1 | 30 days supply | Qty: 50 | Fill #0
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
## 2015-12-18 ENCOUNTER — Encounter: Payer: Self-pay | Admitting: Family Medicine

## 2015-12-18 ENCOUNTER — Ambulatory Visit (INDEPENDENT_AMBULATORY_CARE_PROVIDER_SITE_OTHER): Payer: 59 | Admitting: Family Medicine

## 2015-12-18 DIAGNOSIS — M48061 Spinal stenosis, lumbar region without neurogenic claudication: Secondary | ICD-10-CM

## 2015-12-18 DIAGNOSIS — M999 Biomechanical lesion, unspecified: Secondary | ICD-10-CM

## 2015-12-18 MED ORDER — GABAPENTIN 100 MG PO CAPS: 200.0000 mg | ORAL_CAPSULE | Freq: Every day | ORAL | 3 refills | Status: DC

## 2015-12-18 MED FILL — GABAPENTIN 100 MG CAPSULE: 100 | 30 days supply | Qty: 60 | Fill #0

## 2015-12-18 NOTE — Assessment & Plan Note (Signed)
Does have spinal stenosis. Patient wants to avoid any type of surgery at this moment. Has had one injection and may need more in the future. Started on gabapentin, responded well to osteopathic manipulation. We discussed icing regimen and home exercises. Patient will continue to stay active. We will see patient again in 3-4 weeks for further evaluation

## 2015-12-18 NOTE — Patient Instructions (Signed)
Good to see you  Ice 20 minutes 2 times daily. Usually after activity and before bed. Exercises 3 times a week.  duexis 3 times a day for 3 days Gabapentin 200mg  at night OTC Vitamin D 2000 IU daily  Turmeric 500mg  twice daily  See me again in 3-4 weeks

## 2015-12-18 NOTE — Assessment & Plan Note (Signed)
Decision today to treat with OMT was based on Physical Exam  After verbal consent patient was treated with HVLA, ME techniques in cervical, thoracic, lumbar and sacral areas  Patient tolerated the procedure well with improvement in symptoms  Patient given exercises, stretches and lifestyle modifications  See medications in patient instructions if given  Patient will follow up in 3-4 weeks  
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
## 2016-01-15 ENCOUNTER — Ambulatory Visit (INDEPENDENT_AMBULATORY_CARE_PROVIDER_SITE_OTHER): Payer: 59 | Admitting: Family Medicine

## 2016-01-15 ENCOUNTER — Encounter: Payer: Self-pay | Admitting: Family Medicine

## 2016-01-15 VITALS — BP 114/74 | HR 98 | Ht 65.0 in | Wt 211.0 lb

## 2016-01-15 DIAGNOSIS — M999 Biomechanical lesion, unspecified: Secondary | ICD-10-CM

## 2016-01-15 DIAGNOSIS — M48061 Spinal stenosis, lumbar region without neurogenic claudication: Secondary | ICD-10-CM

## 2016-01-15 MED ORDER — GABAPENTIN 100 MG PO CAPS: 200.0000 mg | ORAL_CAPSULE | Freq: Every day | ORAL | 3 refills | Status: DC

## 2016-01-15 NOTE — Assessment & Plan Note (Signed)
Patient seems to be doing relatively well overall. Patient has been doing more activity modifications, weight loss, core strengthening and patient has responded well to osteopathic manipulation. Given more hip abductor strengthening exercises. Has muscle relaxer for break through pain and will continue the gabapentin. Follow-up again in 6-8 weeks for further evaluation and treatment.

## 2016-01-15 NOTE — Assessment & Plan Note (Signed)
Decision today to treat with OMT was based on Physical Exam  After verbal consent patient was treated with HVLA, ME techniques in cervical, thoracic, lumbar and sacral areas  Patient tolerated the procedure well with improvement in symptoms  Patient given exercises, stretches and lifestyle modifications  See medications in patient instructions if given  Patient will follow up in 6-8 weeks                   

## 2016-01-15 NOTE — Patient Instructions (Signed)
Good to see you  Rebecca Maxwell is your friend.  Keep it up Exercises on wall.  Heel and butt touching.  Raise leg 6 inches and hold 2 seconds.  Down slow for count of 4 seconds.  1 set of 30 reps daily on both sides.  See me again in 6-8 weeks.

## 2016-01-22 MED FILL — VICTOZA 18 MG/3 ML INJECT P: 18 | 30 days supply | Qty: 9 | Fill #2

## 2016-01-22 MED FILL — GABAPENTIN 100 MG CAPSULE: 100 | 90 days supply | Qty: 180 | Fill #0 | Status: TO

## 2016-01-29 ENCOUNTER — Other Ambulatory Visit: Payer: Self-pay | Admitting: Family Medicine

## 2016-01-29 DIAGNOSIS — F909 Attention-deficit hyperactivity disorder, unspecified type: Secondary | ICD-10-CM

## 2016-01-29 MED ORDER — AMPHETAMINE-DEXTROAMPHETAMINE 20 MG PO TABS: 20.0000 mg | ORAL_TABLET | Freq: Two times a day (BID) | ORAL | 0 refills | Status: DC

## 2016-02-03 MED FILL — DEXTROAMP-AMPHETAMIN 20 MG: 20 | 90 days supply | Qty: 180 | Fill #0

## 2016-02-08 IMAGING — MR MR LUMBAR SPINE W/O CM
4 of 5 series · 24 of 48 positions shown · non-contrast
Comparison: Lumbar x-rays 10/31/2014

CLINICAL DATA: Acute right-sided low back pain without sciatica.

EXAM:
MRI LUMBAR SPINE WITHOUT CONTRAST
TECHNIQUE: Multiplanar, multisequence MR imaging of the lumbar spine was
performed. No intravenous contrast was administered.

[Series 2: T2 · sagittal · 4.0mm · 0.81mm/px · 6 of 15 slices shown (1 of 2)]
[im 1/15]
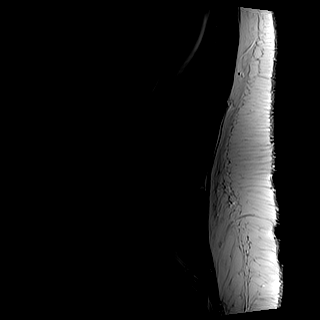
[im 3/15]
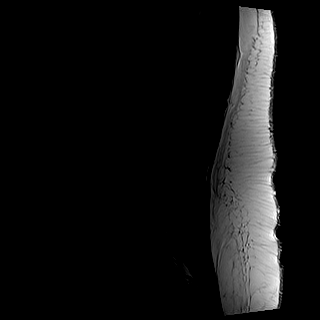
[im 6/15]
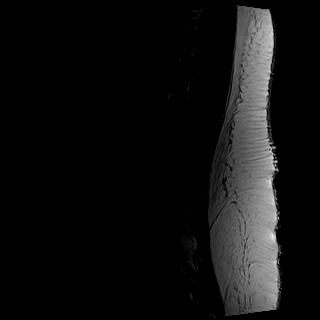
[im 9/15]
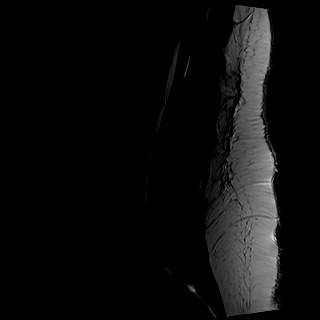
[im 12/15]
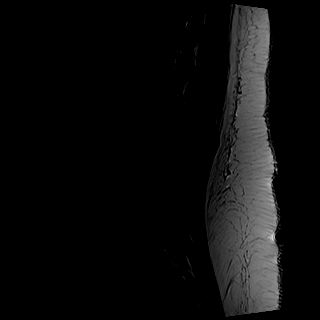
[im 15/15]
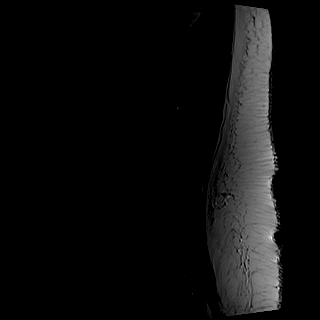

[Series 3: T1 · sagittal · 4.0mm · 0.41mm/px · 6 of 15 slices shown (1 of 2)]
[im 1/15]
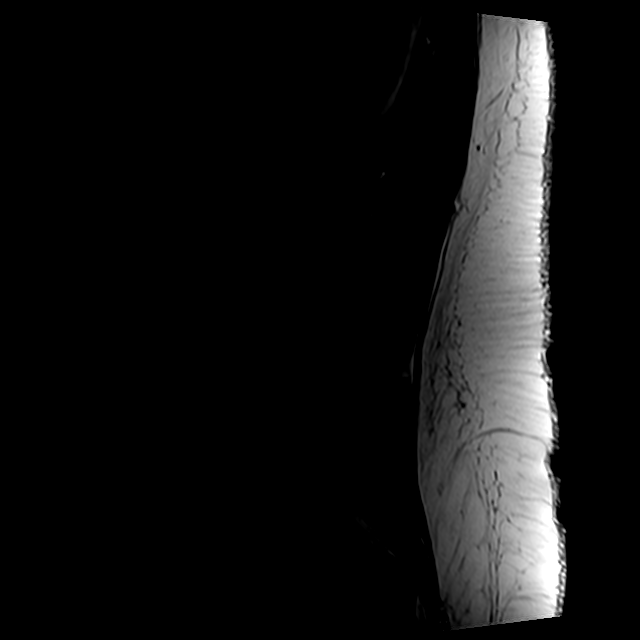
[im 3/15]
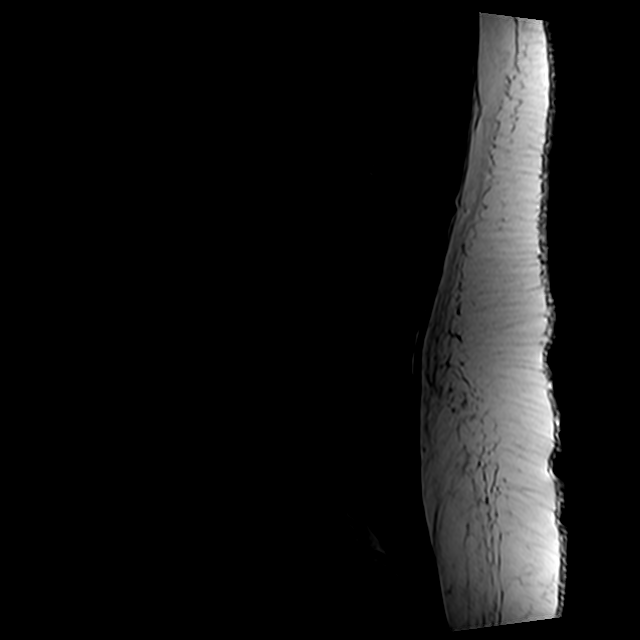
[im 6/15]
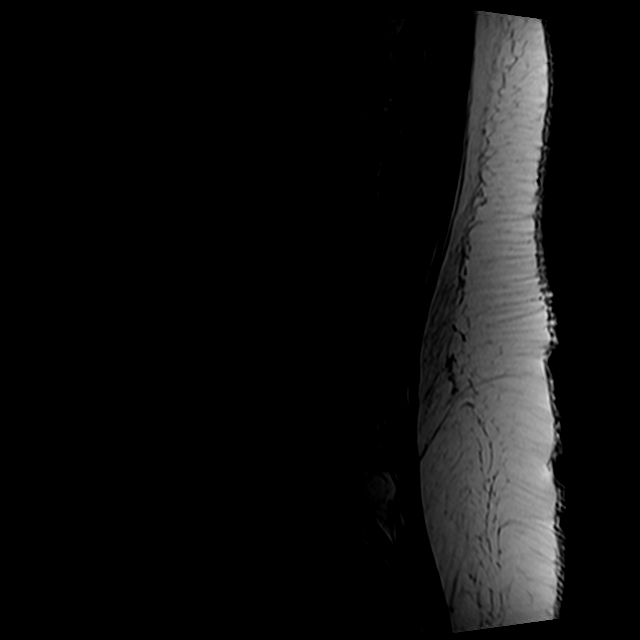
[im 9/15]
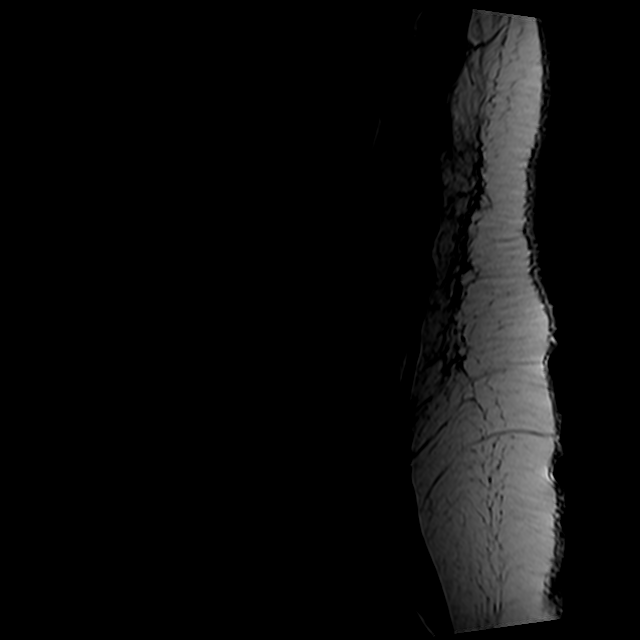
[im 12/15]
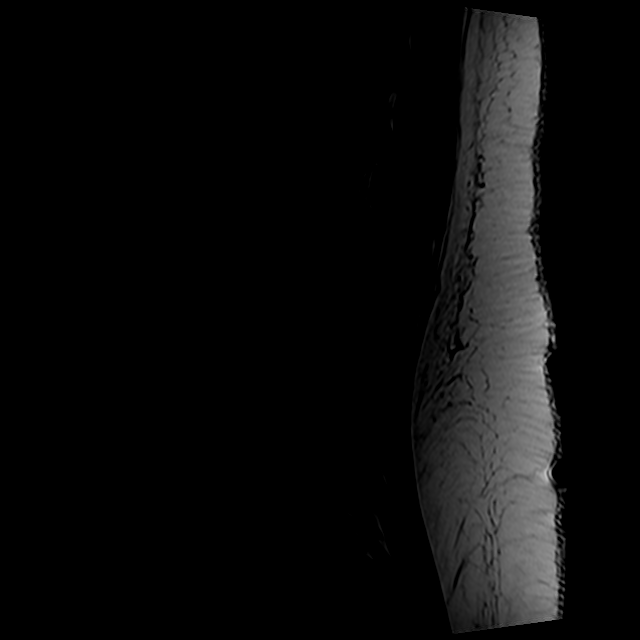
[im 15/15]
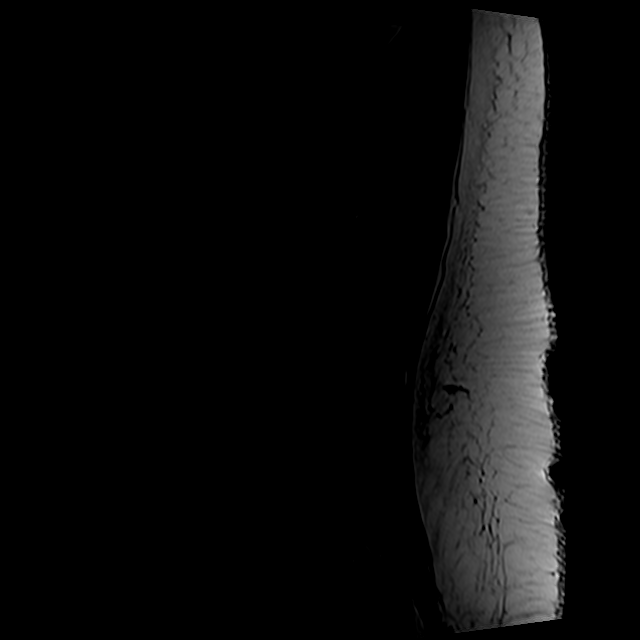

[Series 5: T1 · axial · 4.0mm · 0.31mm/px · z∈[-118,+33]mm · 3 of 36 slices shown (2 of 2)]
[im 6/36]
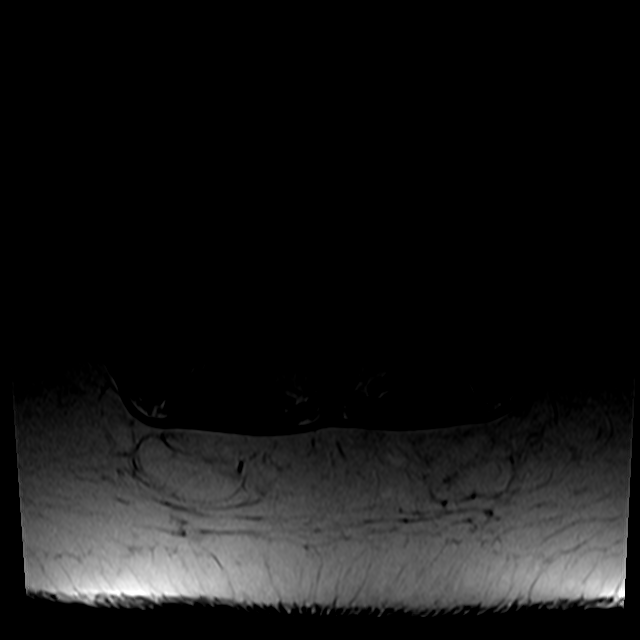
[im 18/36]
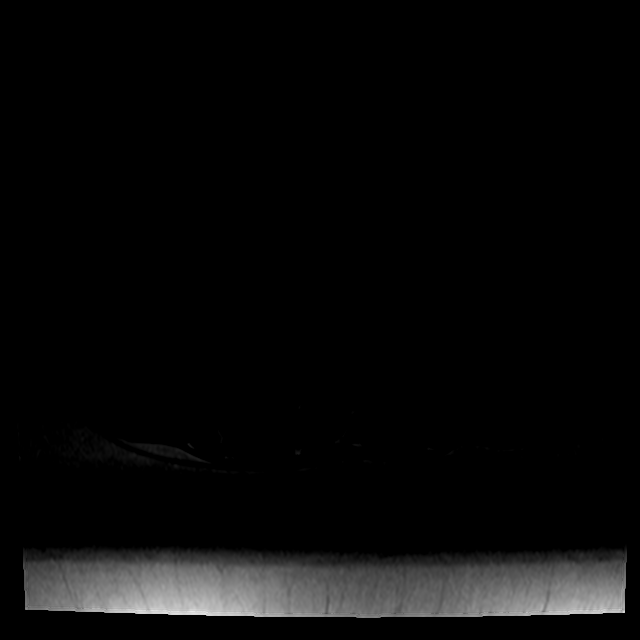
[im 31/36]
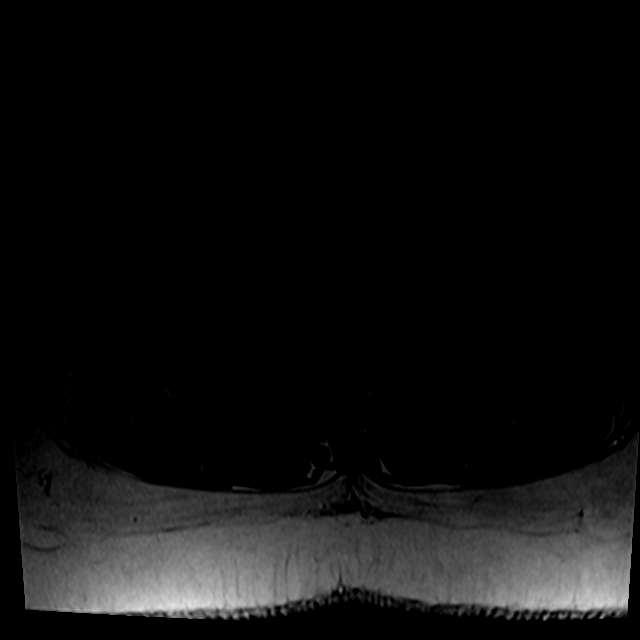

[Series 6: T2 · axial · 4.0mm · 0.78mm/px · z∈[-142,+58]mm · 9 of 36 slices shown (2 of 2)]
[im 1/36]
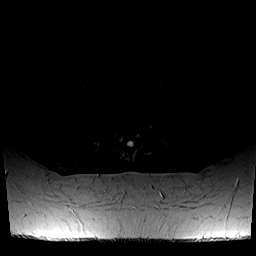
[im 6/36]
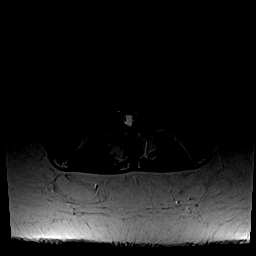
[im 11/36]
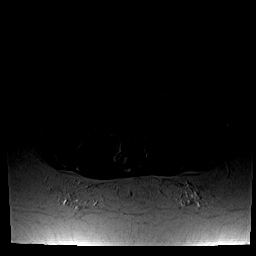
[im 16/36]
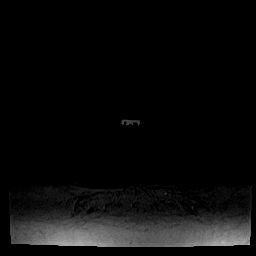
[im 18/36]
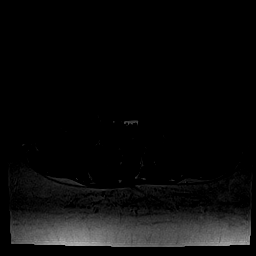
[im 21/36]
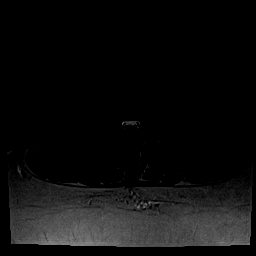
[im 26/36]
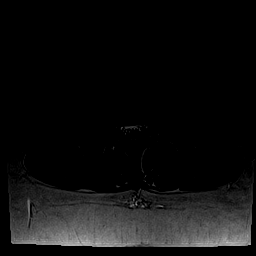
[im 31/36]
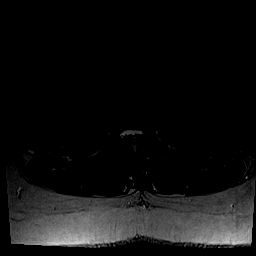
[im 36/36]
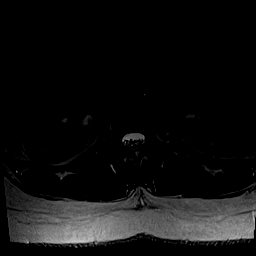

[24 of 48 positions shown; findings below may reference images not displayed]

FINDINGS: Normal lumbar alignment. Negative for fracture. Mild congenital
stenosis of the lumbar canal due to short pedicles. Conus medullaris
normal and terminates at T12-L1

L1-2: Shallow central disc protrusion he. Mild facet degeneration
without significant spinal stenosis

L2-3: Mild disc bulging and facet degeneration. Mild spinal
stenosis.

L3-4: Mild disc bulging and mild facet degeneration. Mild spinal
stenosis.

L4-5: Disc degeneration. Central disc protrusion. Moderate facet
hypertrophy. Severe spinal stenosis. Subarticular stenosis
bilaterally. L4 nerve roots exit without significant stenosis.

L5-S1:  Mild facet degeneration.
IMPRESSION: Mild congenital stenosis of the lumbar spine. Congenital and
acquired stenosis of a mild degree at L2-3 and L3-4

Severe spinal stenosis L4-5 with central disc protrusion and facet
hypertrophy.

## 2016-03-05 ENCOUNTER — Ambulatory Visit: Payer: Self-pay | Admitting: Family Medicine

## 2016-03-16 ENCOUNTER — Other Ambulatory Visit: Payer: Self-pay | Admitting: Family Medicine

## 2016-03-16 DIAGNOSIS — N803 Endometriosis of pelvic peritoneum, unspecified: Secondary | ICD-10-CM

## 2016-03-16 MED ORDER — NORETHINDRONE ACETATE 5 MG PO TABS: 5.0000 mg | ORAL_TABLET | Freq: Every day | ORAL | 2 refills | Status: DC | PRN

## 2016-03-16 MED FILL — NORETHINDRONE 5 MG TABLET: 5 | 30 days supply | Qty: 30 | Fill #0

## 2016-03-16 NOTE — Progress Notes (Signed)
Still having some pain once a month with her IUD. She would like something else for pain, during that time. Will try Aygestin.

## 2016-03-22 ENCOUNTER — Other Ambulatory Visit: Payer: Self-pay | Admitting: Family Medicine

## 2016-03-22 ENCOUNTER — Other Ambulatory Visit: Payer: Self-pay | Admitting: *Deleted

## 2016-03-22 DIAGNOSIS — R319 Hematuria, unspecified: Secondary | ICD-10-CM | POA: Diagnosis not present

## 2016-03-24 LAB — URINE CULTURE

## 2016-04-05 ENCOUNTER — Other Ambulatory Visit: Payer: Self-pay

## 2016-04-05 MED ORDER — HYDROCORTISONE 2.5 % EX OINT: TOPICAL_OINTMENT | Freq: Two times a day (BID) | CUTANEOUS | 2 refills | Status: DC

## 2016-04-05 MED FILL — HYDROCORTISONE 2.5% OINTMEN: 2.5 | 10 days supply | Qty: 20 | Fill #0

## 2016-04-14 ENCOUNTER — Telehealth: Payer: Self-pay | Admitting: *Deleted

## 2016-04-14 DIAGNOSIS — N803 Endometriosis of pelvic peritoneum, unspecified: Secondary | ICD-10-CM

## 2016-04-14 MED ORDER — NORETHINDRONE ACETATE 5 MG PO TABS: 5.0000 mg | ORAL_TABLET | Freq: Every day | ORAL | 2 refills | Status: DC | PRN

## 2016-04-14 NOTE — Telephone Encounter (Signed)
Refill for Aygestin sent to pharmacy per Dr Kennon Rounds order.

## 2016-04-15 ENCOUNTER — Telehealth: Payer: Self-pay | Admitting: *Deleted

## 2016-04-15 DIAGNOSIS — N803 Endometriosis of pelvic peritoneum, unspecified: Secondary | ICD-10-CM

## 2016-04-15 MED ORDER — NORETHINDRONE ACETATE 5 MG PO TABS: 5.0000 mg | ORAL_TABLET | Freq: Every day | ORAL | 2 refills | Status: DC | PRN

## 2016-04-15 MED FILL — NORETHINDRONE 5 MG TABLET: 5 | 90 days supply | Qty: 90 | Fill #0

## 2016-04-15 NOTE — Telephone Encounter (Signed)
Refill for Aygestin sent to incorrect pharmacy, resent medication to corrected pharmacy with 90 day supply

## 2016-04-30 ENCOUNTER — Other Ambulatory Visit: Payer: Self-pay | Admitting: Family Medicine

## 2016-04-30 DIAGNOSIS — F909 Attention-deficit hyperactivity disorder, unspecified type: Secondary | ICD-10-CM

## 2016-04-30 MED ORDER — AMPHETAMINE-DEXTROAMPHETAMINE 20 MG PO TABS: 20.0000 mg | ORAL_TABLET | Freq: Two times a day (BID) | ORAL | 0 refills | Status: DC

## 2016-04-30 MED FILL — DEXTROAMP-AMPHETAMIN 20 MG: 20 | 90 days supply | Qty: 180 | Fill #0

## 2016-05-05 MED FILL — GABAPENTIN 100 MG CAPSULE: 100 | 90 days supply | Qty: 180 | Fill #0

## 2016-06-18 ENCOUNTER — Other Ambulatory Visit: Payer: Self-pay | Admitting: Family Medicine

## 2016-06-18 DIAGNOSIS — Z0000 Encounter for general adult medical examination without abnormal findings: Secondary | ICD-10-CM
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
## 2016-06-22 ENCOUNTER — Ambulatory Visit (INDEPENDENT_AMBULATORY_CARE_PROVIDER_SITE_OTHER): Payer: 59 | Admitting: Family Medicine

## 2016-06-22 ENCOUNTER — Encounter: Payer: Self-pay | Admitting: Family Medicine

## 2016-06-22 VITALS — BP 110/72 | HR 98 | Resp 16 | Ht 65.0 in | Wt 211.0 lb

## 2016-06-22 DIAGNOSIS — M999 Biomechanical lesion, unspecified: Secondary | ICD-10-CM | POA: Diagnosis not present

## 2016-06-22 DIAGNOSIS — M48061 Spinal stenosis, lumbar region without neurogenic claudication: Secondary | ICD-10-CM | POA: Diagnosis not present

## 2016-06-22 NOTE — Patient Instructions (Signed)
Good to see you  Alvera Singh is your friend.  See me again 4-6 weeks

## 2016-06-22 NOTE — Assessment & Plan Note (Signed)
Patient is stable overall with some mild radicular symptoms. We discussed potentially increasing her gabapentin which patient declined. Responded well to manipulation over the course of time RTC 4 weeks

## 2016-06-22 NOTE — Assessment & Plan Note (Signed)
Decision today to treat with OMT was based on Physical Exam  After verbal consent patient was treated with HVLA, ME, FPR techniques in cervical, thoracic, lumbar and sacral areas  Patient tolerated the procedure well with improvement in symptoms  Patient given exercises, stretches and lifestyle modifications  See medications in patient instructions if given  Patient will follow up in 4-6 weeks 

## 2016-06-23 ENCOUNTER — Other Ambulatory Visit: Payer: 59 | Admitting: *Deleted

## 2016-06-23 ENCOUNTER — Other Ambulatory Visit: Payer: Self-pay | Admitting: *Deleted

## 2016-06-23 ENCOUNTER — Encounter: Payer: Self-pay | Admitting: Family Medicine

## 2016-06-23 DIAGNOSIS — Z0000 Encounter for general adult medical examination without abnormal findings: Secondary | ICD-10-CM | POA: Diagnosis not present

## 2016-06-23 NOTE — Addendum Note (Signed)
Addended by: Ricka Burdock on: 06/23/2016 11:14 AM   Modules accepted: Orders

## 2016-06-24 LAB — COMPREHENSIVE METABOLIC PANEL
ALBUMIN: 4.1 g/dL (ref 3.5–5.5)
ALK PHOS: 64 IU/L (ref 39–117)
ALT: 16 IU/L (ref 0–32)
AST: 16 IU/L (ref 0–40)
Albumin/Globulin Ratio: 1.5 (ref 1.2–2.2)
BUN / CREAT RATIO: 11 (ref 9–23)
BUN: 8 mg/dL (ref 6–20)
CHLORIDE: 104 mmol/L (ref 96–106)
CO2: 20 mmol/L (ref 18–29)
CREATININE: 0.75 mg/dL (ref 0.57–1.00)
Calcium: 9.1 mg/dL (ref 8.7–10.2)
GFR calc Af Amer: 117 mL/min/{1.73_m2} (ref >59)
GFR calc non Af Amer: 101 mL/min/{1.73_m2} (ref >59)
GLOBULIN, TOTAL: 2.8 g/dL (ref 1.5–4.5)
GLUCOSE: 93 mg/dL (ref 65–99)
Potassium: 4.3 mmol/L (ref 3.5–5.2)
SODIUM: 139 mmol/L (ref 134–144)
Total Protein: 6.9 g/dL (ref 6.0–8.5)

## 2016-06-24 LAB — VITAMIN D 25 HYDROXY (VIT D DEFICIENCY, FRACTURES): VIT D 25 HYDROXY: 32 ng/mL (ref 30.0–100.0)

## 2016-06-24 LAB — CBC
Hematocrit: 41.1 % (ref 34.0–46.6)
Hemoglobin: 13.7 g/dL (ref 11.1–15.9)
MCH: 31.5 pg (ref 26.6–33.0)
MCHC: 33.3 g/dL (ref 31.5–35.7)
MCV: 95 fL (ref 79–97)
Platelets: 354 10*3/uL (ref 150–379)
RBC: 4.35 x10E6/uL (ref 3.77–5.28)
RDW: 13.2 % (ref 12.3–15.4)
WBC: 8.5 10*3/uL (ref 3.4–10.8)

## 2016-06-24 LAB — HEMOGLOBIN A1C
Est. average glucose Bld gHb Est-mCnc: 111 mg/dL
HEMOGLOBIN A1C: 5.5 % (ref 4.8–5.6)

## 2016-06-24 LAB — LIPID PANEL
CHOL/HDL RATIO: 4.1 ratio (ref 0.0–4.4)
Cholesterol, Total: 165 mg/dL (ref 100–199)
HDL: 40 mg/dL (ref >39)
LDL Calculated: 110 mg/dL — ABNORMAL HIGH (ref 0–99)
Triglycerides: 76 mg/dL (ref 0–149)
VLDL Cholesterol Cal: 15 mg/dL (ref 5–40)

## 2016-06-24 LAB — TSH: TSH: 1.48 u[IU]/mL (ref 0.450–4.500)

## 2016-06-24 MED ORDER — CYCLOBENZAPRINE HCL 10 MG PO TABS: 10.0000 mg | ORAL_TABLET | Freq: Three times a day (TID) | ORAL | 1 refills | Status: DC | PRN

## 2016-06-24 MED FILL — CYCLOBENZAPRINE 10 MG TAB: 10 | 10 days supply | Qty: 30 | Fill #1

## 2016-07-20 ENCOUNTER — Telehealth: Payer: Self-pay | Admitting: *Deleted

## 2016-07-20 DIAGNOSIS — N803 Endometriosis of pelvic peritoneum, unspecified: Secondary | ICD-10-CM

## 2016-07-20 DIAGNOSIS — F909 Attention-deficit hyperactivity disorder, unspecified type: Secondary | ICD-10-CM

## 2016-07-20 MED ORDER — AMPHETAMINE-DEXTROAMPHETAMINE 20 MG PO TABS: 20.0000 mg | ORAL_TABLET | Freq: Two times a day (BID) | ORAL | 0 refills | Status: DC

## 2016-07-20 MED ORDER — NORETHINDRONE ACETATE 5 MG PO TABS: 5.0000 mg | ORAL_TABLET | Freq: Every day | ORAL | 2 refills | Status: DC | PRN

## 2016-07-20 MED FILL — NORETHINDRONE 5 MG TABLET: 5 | 90 days supply | Qty: 90 | Fill #0

## 2016-07-21 NOTE — Telephone Encounter (Signed)
Pt requesting a refill on her medications, request approved by Dr Kennon Rounds.

## 2016-07-27 MED FILL — DEXTROAMP-AMPHETAMIN 20 MG: 20 | 90 days supply | Qty: 180 | Fill #0

## 2016-08-03 ENCOUNTER — Other Ambulatory Visit: Payer: Self-pay | Admitting: Obstetrics & Gynecology

## 2016-08-03 MED ORDER — FLUCONAZOLE 150 MG PO TABS: 150.0000 mg | ORAL_TABLET | ORAL | 3 refills | Status: DC

## 2016-08-06 MED FILL — FLUCONAZOLE 150 MG TABLET: 150 | 9 days supply | Qty: 3 | Fill #0

## 2016-08-10 ENCOUNTER — Encounter: Payer: Self-pay | Admitting: Family Medicine

## 2016-09-02 ENCOUNTER — Ambulatory Visit: Payer: Self-pay | Admitting: Family Medicine

## 2016-09-16 ENCOUNTER — Encounter: Payer: Self-pay | Admitting: Family Medicine

## 2016-09-16 ENCOUNTER — Ambulatory Visit (INDEPENDENT_AMBULATORY_CARE_PROVIDER_SITE_OTHER): Payer: 59 | Admitting: Family Medicine

## 2016-09-16 ENCOUNTER — Ambulatory Visit: Payer: Self-pay

## 2016-09-16 VITALS — BP 108/74 | HR 96 | Ht 65.0 in

## 2016-09-16 DIAGNOSIS — M7500 Adhesive capsulitis of unspecified shoulder: Secondary | ICD-10-CM | POA: Insufficient documentation

## 2016-09-16 DIAGNOSIS — M25511 Pain in right shoulder: Secondary | ICD-10-CM | POA: Diagnosis not present

## 2016-09-16 DIAGNOSIS — M999 Biomechanical lesion, unspecified: Secondary | ICD-10-CM

## 2016-09-16 DIAGNOSIS — M48061 Spinal stenosis, lumbar region without neurogenic claudication: Secondary | ICD-10-CM | POA: Diagnosis not present

## 2016-09-16 DIAGNOSIS — M7501 Adhesive capsulitis of right shoulder: Secondary | ICD-10-CM

## 2016-09-16 NOTE — Assessment & Plan Note (Signed)
Patient given injection today. Tolerated the procedure well. We discussed icing regimen and home exercises. We discussed which activities to do a which ones to avoid. Patient will start increasing activity as tolerated. Follow-up again 4-6 weeks. May need physical therapy

## 2016-09-16 NOTE — Assessment & Plan Note (Signed)
Stable at the moment. Responding fairly well to osteopathic manipulation. We did it again. We discussed on core strengthening, ergonomic, which activities doing which ones to avoid. Follow-up again in 4 weeks

## 2016-09-16 NOTE — Assessment & Plan Note (Signed)
Decision today to treat with OMT was based on Physical Exam  After verbal consent patient was treated with HVLA, ME, FPR techniques in cervical, thoracic, lumbar and sacral areas  Patient tolerated the procedure well with improvement in symptoms  Patient given exercises, stretches and lifestyle modifications  See medications in patient instructions if given  Patient will follow up in 4 weeks 

## 2016-09-16 NOTE — Patient Instructions (Signed)
Good to see you  Rebecca Maxwell is your friend.  Exercises 3 times a week.  Keep the shoulder moving.  The back is dong well  Continue the vitamins See me again in 4-6 weeks.

## 2016-09-16 NOTE — Progress Notes (Signed)
[facility] [address] [place], [place] [address] Phone: [phone] Subjective:       [id number]  Rebecca Maxwell is a 41 y.o. female coming in with complaint CC: Shoulder pain and back pain  [id number]   coming in with complaint of  Right shoulder pain. Has had a history of bursitis previously. This is greater than a year ago. Having worsening symptoms. Waking her up at night. Noticing some mild decrease in range of motion.  We have also seems patient previously for chronic upper back pain. Also having lower back pain. Does have history of spinal stenosis.Has been responding fairly well to a supine manipulation. Worsening tightness.    past imaging includes from 11/25/2014. 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  Past Medical History:  Diagnosis Date  . ADHD (attention deficit hyperactivity disorder)   . Endometriosis STAGE 3  . GERD (gastroesophageal reflux disease)   . IUD (intrauterine device) in place 05/12/2015   Placed 04/10/15  . 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 ANKLE RECONSTRUCTION Bilateral 1997  . CHOLECYSTECTOMY    . Uterine ablation    . WISDOM TOOTH EXTRACTION Bilateral    Social History   Social History  . Marital status: Married    Spouse name: N/A  . Number of children: N/A  . Years of education: N/A   Occupational History  . [employer]   Social History Main Topics  . Smoking status: Current Some Day Smoker    Packs/day: 0.50    Years: 10.00    Types: Cigarettes    Last attempt to quit: 05/30/2012  . Smokeless tobacco: Never Used     Comment: 1/2 pack every 3 days  . Alcohol use No  . Drug use: Unknown  . Sexual activity: Yes   Other Topics Concern  . None    Social History Narrative  . None   Allergies  Allergen Reactions  . Norflex [Orphenadrine]     CAUSES HEADACHES  . Other Other (See Comments)    Oral Pain Medications  . Penicillins Nausea And Vomiting  . Tramadol-Acetaminophen Anxiety   Family History  Problem Relation Age of Onset  . Diabetes Mother   . Hyperlipidemia Mother   . Hypertension Mother   . Diabetes Sister   . Depression Sister   . Alcohol abuse Father   . Alcohol abuse Maternal Grandmother   . Heart attack Maternal Grandmother   . Stroke Maternal Grandmother   . Cancer Paternal Grandmother        colon    Past medical history, social, surgical and family history all reviewed in electronic medical record.  No pertanent information unless stated regarding to the chief complaint.  Review of Systems: No headache, visual changes, nausea, vomiting, diarrhea, constipation, dizziness, abdominal pain, skin rash, fevers, chills, night sweats, weight loss, swollen lymph nodes, body aches, joint swelling,chest pain, shortness of breath, mood changes.  positive muscle aches  Objective  Blood pressure 108/74, height 5\' 5"  (1.651 m).   Systems examined below as of 09/16/16 General: NAD A&O x3 mood, affect normal  HEENT: Pupils equal, extraocular movements intact no nystagmus Respiratory: not short of breath at rest or with speaking Cardiovascular: No lower extremity edema, non tender Skin:  Warm dry intact with no signs of infection or rash on extremities or on axial skeleton. Abdomen: Soft nontender, no masses Neuro: Cranial nerves  intact, neurovascularly intact in all extremities with 2+ DTRs and 2+ pulses. Lymph: No lymphadenopathy appreciated today  Gait normal with good balance and coordination.  MSK: Non tender with full range of motion and good stability and symmetric strength and tone of  elbows, wrist,  knee hips and ankles bilaterally.    Back exam   Motion: 45 of flexion, 25 of extension, 35 of side  bending and rotation bilaterally Rotation to 35 deg bilaterally  SLR laying: Mild irritation in the left leg XSLR laying: Negative  Palpable tenderness: Mild increased tenderness in the paraspinal musculature of the lumbar spine FABER: negative.  Sensory change: Gross sensation intact to all lumbar and sacral dermatomes.  Reflexes: 2+ at both patellar tendons, 2+ at achilles tendons, Babinski's downgoing.  Strength at foot  Plantar-flexion: 5/5 Dorsi-flexion: 5/5 Eversion: 5/5 Inversion: 5/5  Leg strength  Quad: 5/5 Hamstring: 5/5 Hip flexor: 5/5 Hip abductors: 4/5  Gait unremarkable.  Osteopathic findings C2 flexed rotated and side bent right C4 flexed rotated and side bent left T3 extended rotated and side bent right inhaled third rib T9 extended rotated and side bent left L2 flexed rotated and side bent right Sacrum right on right   Shoulder: Right Inspection reveals no abnormalities, atrophy or asymmetry. Palpation is normal with no tenderness over AC joint or bicipital groove. Lacking the last 5 of internal rotation and external rotation. Rotator cuff strength normal throughout. signs of impingement with positive Neer and Hawkin's tests, but negative empty can sign. Speeds and Yergason's tests normal. No labral pathology noted with negative Obrien's, negative clunk and good stability. Normal scapular function observed. No painful arc and no drop arm sign. No apprehension sign Contralateral shoulder unremarkable  MSK US performed of: Right This study was ordered, performed, and interpreted by Charlann Boxer D.O.  Shoulder:   Supraspinatus:  Appears normal on long and transverse views, Bursal bulge seen with shoulder abduction on impingement view. Infraspinatus:  Appears normal on long and transverse views. Significant increase in Doppler flow Subscapularis:  Appears normal on long and transverse views. Positive bursa Teres Minor:  Appears normal on long and transverse  views. AC joint:  Capsule undistended, no geyser sign. Glenohumeral Joint:  Appears normal without effusion. Glenoid Labrum:  Intact without visualized tears. Biceps Tendon:  Appears normal on long and transverse views, no fraying of tendon, tendon located in intertubercular groove, no subluxation with shoulder internal or external rotation.  Impression: Subacromial bursitis  Procedure: Real-time Ultrasound Guided Injection of right glenohumeral joint Device: GE Logiq E  Ultrasound guided injection is preferred based studies that show increased duration, increased effect, greater accuracy, decreased procedural pain, increased response rate with ultrasound guided versus blind injection.  Verbal informed consent obtained.  Time-out conducted.  Noted no overlying erythema, induration, or other signs of local infection.  Skin prepped in a sterile fashion.  Local anesthesia: Topical Ethyl chloride.  With sterile technique and under real time ultrasound guidance:  Joint visualized.  23g 1  inch needle inserted posterior approach. Pictures taken for needle placement. Patient did have injection of 2 cc of 1% lidocaine, 2 cc of 0.5% Marcaine, and 1.0 cc of Kenalog 40 mg/dL. Completed without difficulty  Pain immediately resolved suggesting accurate placement of the medication.  Advised to call if fevers/chills, erythema, induration, drainage, or persistent bleeding.  Images permanently stored  and available for review in the ultrasound unit.  Impression: Technically successful ultrasound guided injection.    Impression and Recommendations:     This case required medical decision making of moderate complexity.      Note: This dictation was prepared with Dragon dictation along with smaller phrase technology. Any transcriptional errors that result from this process are unintentional.

## 2016-09-22 ENCOUNTER — Other Ambulatory Visit: Payer: Self-pay | Admitting: Family Medicine

## 2016-09-22 DIAGNOSIS — R21 Rash and other nonspecific skin eruption: Secondary | ICD-10-CM

## 2016-09-22 MED ORDER — NYSTATIN-TRIAMCINOLONE 100000-0.1 UNIT/GM-% EX OINT: 1.0000 "application " | TOPICAL_OINTMENT | Freq: Two times a day (BID) | CUTANEOUS | 0 refills | Status: DC

## 2016-09-22 MED ORDER — MUPIROCIN 2 % EX OINT: 1.0000 "application " | TOPICAL_OINTMENT | Freq: Two times a day (BID) | CUTANEOUS | 0 refills | Status: DC

## 2016-09-22 NOTE — Progress Notes (Signed)
Rash on groin area. Tried neopsporin, hydrocortisone, gold bond, diflucan x 3.

## 2016-10-04 MED FILL — MUPIROCIN 2% OINTMENT: 2 | 30 days supply | Qty: 22 | Fill #0

## 2016-10-04 MED FILL — NYSTATIN-TRIAMCINOLONE OINT: 100000-0.1 | 15 days supply | Qty: 30 | Fill #0

## 2016-10-14 ENCOUNTER — Ambulatory Visit: Payer: 59 | Admitting: Family Medicine

## 2016-10-25 ENCOUNTER — Other Ambulatory Visit: Payer: Self-pay | Admitting: Family Medicine

## 2016-10-25 DIAGNOSIS — F909 Attention-deficit hyperactivity disorder, unspecified type: Secondary | ICD-10-CM

## 2016-10-25 MED ORDER — AMPHETAMINE-DEXTROAMPHETAMINE 20 MG PO TABS: 20.0000 mg | ORAL_TABLET | Freq: Two times a day (BID) | ORAL | 0 refills | Status: DC

## 2016-10-27 ENCOUNTER — Ambulatory Visit: Payer: 59 | Admitting: Family Medicine

## 2016-11-08 ENCOUNTER — Encounter: Payer: Self-pay | Admitting: Family Medicine

## 2016-11-16 NOTE — Progress Notes (Signed)
[facility] [address] [place], [place] [address] Phone: [phone] Subjective:    I'm seeing this patient by the request  of:    CC:   [id number]  Rebecca Maxwell is a 41 y.o. female coming in for follow up for right shoulder pain. She said that her shoulder is doing better but still not 100%. She said overhead motions are the most bothersome. Patient states it can radiate minimally. Can do all daily activities. Working on a more regular basis.      Past Medical History:  Diagnosis Date  . ADHD (attention deficit hyperactivity disorder)   . Endometriosis STAGE 3  . GERD (gastroesophageal reflux disease)   . IUD (intrauterine device) in place 05/12/2015   Placed 04/10/15  . 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 ANKLE RECONSTRUCTION Bilateral 1997  . CHOLECYSTECTOMY    . Uterine ablation    . WISDOM TOOTH EXTRACTION Bilateral    Social History   Social History  . Marital status: Married    Spouse name: N/A  . Number of children: N/A  . Years of education: N/A   Occupational History  . [employer]   Social History Main Topics  . Smoking status: Current Some Day Smoker    Packs/day: 0.50    Years: 10.00    Types: Cigarettes    Last attempt to quit: 05/30/2012  . Smokeless tobacco: Never Used     Comment: 1/2 pack every 3 days  . Alcohol use No  . Drug use: Unknown  . Sexual activity: Yes   Other Topics Concern  . Not on file   Social History Narrative  . No narrative on file   Allergies  Allergen Reactions  . Norflex [Orphenadrine]     CAUSES HEADACHES  . Other Other (See Comments)    Oral Pain Medications  . Penicillins Nausea And Vomiting  . Tramadol-Acetaminophen Anxiety   Family History  Problem Relation Age of Onset  . Diabetes Mother   . Hyperlipidemia Mother   . Hypertension Mother     . Diabetes Sister   . Depression Sister   . Alcohol abuse Father   . Alcohol abuse Maternal Grandmother   . Heart attack Maternal Grandmother   . Stroke Maternal Grandmother   . Cancer Paternal Grandmother        colon     Past medical history, social, surgical and family history all reviewed in electronic medical record.  No pertanent information unless stated regarding to the chief complaint.   Review of Systems:Review of systems updated and as accurate as of 11/17/16  No headache, visual changes, nausea, vomiting, diarrhea, constipation, dizziness, abdominal pain, skin rash, fevers, chills, night sweats, weight loss, swollen lymph nodes, body aches, joint swelling,chest pain, shortness of breath, mood changes.  Positive muscle aches  Objective  Blood pressure 100/72, pulse 96, height 5\' 5"  (1.651 m), weight 221 lb (100.2 kg), SpO2 98 %. Systems examined below as of 11/17/16   General: No apparent distress alert and oriented x3 mood and affect normal, dressed appropriately.  HEENT: Pupils equal, extraocular movements intact  Respiratory: Patient's speak in full sentences and does not appear short of breath  Cardiovascular: No lower extremity edema, non tender, no erythema  Skin: Warm dry intact with no signs of infection or rash on extremities or on axial  skeleton.  Abdomen: Soft nontender  Neuro: Cranial nerves II through XII are intact, neurovascularly intact in all extremities with 2+ DTRs and 2+ pulses.  Lymph: No lymphadenopathy of posterior or anterior cervical chain or axillae bilaterally.  Gait normal with good balance and coordination.  MSK:  Non tender with full range of motion and good stability and symmetric strength and tone of , elbows, wrist, hip, knee and ankles bilaterally.  Shoulder: Right Inspection reveals no abnormalities, atrophy or asymmetry. Palpation is normal with no tenderness over AC joint or bicipital groove. ROM is full in all planes. Rotator cuff  strength normal throughout. Mild impingement Speeds and Yergason's tests normal. No labral pathology noted with negative Obrien's, negative clunk and good stability. Normal scapular function observed. No painful arc and no drop arm sign. No apprehension sign Contralateral shoulder unremarkable       Impression and Recommendations:     This case required medical decision making of moderate complexity.      Note: This dictation was prepared with Dragon dictation along with smaller phrase technology. Any transcriptional errors that result from this process are unintentional.

## 2016-11-17 ENCOUNTER — Ambulatory Visit (INDEPENDENT_AMBULATORY_CARE_PROVIDER_SITE_OTHER): Payer: 59 | Admitting: Family Medicine

## 2016-11-17 VITALS — BP 100/72 | HR 96 | Ht 65.0 in | Wt 221.0 lb

## 2016-11-17 DIAGNOSIS — M999 Biomechanical lesion, unspecified: Secondary | ICD-10-CM

## 2016-11-17 DIAGNOSIS — M48062 Spinal stenosis, lumbar region with neurogenic claudication: Secondary | ICD-10-CM

## 2016-11-17 DIAGNOSIS — M7501 Adhesive capsulitis of right shoulder: Secondary | ICD-10-CM

## 2016-11-17 NOTE — Assessment & Plan Note (Signed)
Severe spinal stenosis at L4-L5 that is congenital. Attempted no supine manipulation. Encourage still core strengthening and working on isometrics as well as avoiding significant extension of the back. Patient will follow-up with me again in 4-8 weeks.

## 2016-11-17 NOTE — Assessment & Plan Note (Signed)
Decision today to treat with OMT was based on Physical Exam  After verbal consent patient was treated with HVLA, ME, FPR techniques in cervical, thoracic, lumbar and sacral areas  Patient tolerated the procedure well with improvement in symptoms  Patient given exercises, stretches and lifestyle modifications  See medications in patient instructions if given  Patient will follow up in 4-8 weeks 

## 2016-11-17 NOTE — Patient Instructions (Signed)
Good to see you   

## 2016-11-17 NOTE — Assessment & Plan Note (Signed)
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 Increase activity as tolerated. Follow-up as needed.

## 2016-11-30 ENCOUNTER — Other Ambulatory Visit: Payer: Self-pay | Admitting: Obstetrics & Gynecology

## 2016-11-30 DIAGNOSIS — M25511 Pain in right shoulder: Secondary | ICD-10-CM

## 2016-12-04 ENCOUNTER — Ambulatory Visit (HOSPITAL_BASED_OUTPATIENT_CLINIC_OR_DEPARTMENT_OTHER): Payer: 59

## 2016-12-29 ENCOUNTER — Other Ambulatory Visit (HOSPITAL_COMMUNITY): Payer: Self-pay | Admitting: Family Medicine

## 2016-12-29 ENCOUNTER — Other Ambulatory Visit: Payer: Self-pay | Admitting: Family Medicine

## 2016-12-29 DIAGNOSIS — R319 Hematuria, unspecified: Secondary | ICD-10-CM | POA: Diagnosis not present

## 2016-12-29 DIAGNOSIS — R300 Dysuria: Secondary | ICD-10-CM

## 2016-12-29 MED ORDER — CIPROFLOXACIN HCL 500 MG PO TABS: 500.0000 mg | ORAL_TABLET | Freq: Two times a day (BID) | ORAL | 0 refills | Status: DC

## 2016-12-29 MED FILL — CIPROFLOXACIN HCL 500 MG TA: 500 | 3 days supply | Qty: 6 | Fill #0

## 2016-12-29 NOTE — Progress Notes (Unsigned)
Udip showed blood and leuks. Will get Cx and treat empirically with cipro, await cx. Warning s/sx of pyelo reviewed.   Caren Macadam, MD, MPH, ABFM Attending [place] for [facility]

## 2017-01-03 ENCOUNTER — Other Ambulatory Visit: Payer: Self-pay

## 2017-01-03 LAB — URINE CULTURE

## 2017-01-03 MED ORDER — DOXYCYCLINE HYCLATE 50 MG PO CAPS: 100.0000 mg | ORAL_CAPSULE | Freq: Two times a day (BID) | ORAL | 0 refills | Status: DC

## 2017-01-20 ENCOUNTER — Other Ambulatory Visit: Payer: Self-pay | Admitting: Family Medicine

## 2017-01-20 DIAGNOSIS — F909 Attention-deficit hyperactivity disorder, unspecified type: Secondary | ICD-10-CM

## 2017-01-20 DIAGNOSIS — E669 Obesity, unspecified: Secondary | ICD-10-CM

## 2017-01-20 DIAGNOSIS — N803 Endometriosis of pelvic peritoneum, unspecified: Secondary | ICD-10-CM

## 2017-01-20 MED ORDER — AMPHETAMINE-DEXTROAMPHETAMINE 20 MG PO TABS: 20.0000 mg | ORAL_TABLET | Freq: Two times a day (BID) | ORAL | 0 refills | Status: DC

## 2017-01-20 MED ORDER — NORETHINDRONE ACETATE 5 MG PO TABS: 5.0000 mg | ORAL_TABLET | Freq: Every day | ORAL | 2 refills | Status: DC | PRN

## 2017-01-20 MED ORDER — VICTOZA 18 MG/3ML ~~LOC~~ SOPN: 1.8000 mg | PEN_INJECTOR | Freq: Every day | SUBCUTANEOUS | 3 refills | Status: DC

## 2017-01-20 MED FILL — DEXTROAMP-AMPHETAMIN 20 MG: 20 | 90 days supply | Qty: 180 | Fill #0

## 2017-01-20 MED FILL — NORETHINDRONE 5 MG TABLET: 5 | 90 days supply | Qty: 90 | Fill #0

## 2017-01-24 MED FILL — VICTOZA 18 MG/3 ML INJECT P: 18 | 30 days supply | Qty: 9 | Fill #0

## 2017-03-23 ENCOUNTER — Other Ambulatory Visit: Payer: Self-pay | Admitting: Obstetrics & Gynecology

## 2017-03-23 DIAGNOSIS — N9089 Other specified noninflammatory disorders of vulva and perineum: Secondary | ICD-10-CM

## 2017-03-24 ENCOUNTER — Other Ambulatory Visit: Payer: Self-pay | Admitting: *Deleted

## 2017-03-24 DIAGNOSIS — N898 Other specified noninflammatory disorders of vagina: Secondary | ICD-10-CM

## 2017-03-24 LAB — HERPES SIMPLEX VIRUS CULTURE

## 2017-03-24 NOTE — Progress Notes (Signed)
HSV 1 and 2 drawn

## 2017-03-28 LAB — HSV 1/2 AB (IGM), IFA W/RFLX TITER
HSV 1 IgM Screen: NEGATIVE
HSV 2 IgM Screen: NEGATIVE

## 2017-04-06 ENCOUNTER — Other Ambulatory Visit: Payer: Self-pay | Admitting: Family Medicine

## 2017-04-06 DIAGNOSIS — R21 Rash and other nonspecific skin eruption: Secondary | ICD-10-CM

## 2017-04-06 MED ORDER — MUPIROCIN 2 % EX OINT: 1.0000 "application " | TOPICAL_OINTMENT | Freq: Two times a day (BID) | CUTANEOUS | 2 refills | Status: DC

## 2017-04-06 MED FILL — MUPIROCIN 2% OINTMENT: 2 | 20 days supply | Qty: 22 | Fill #0

## 2017-04-08 ENCOUNTER — Telehealth: Payer: 59 | Admitting: Family

## 2017-04-08 DIAGNOSIS — J029 Acute pharyngitis, unspecified: Secondary | ICD-10-CM | POA: Diagnosis not present

## 2017-04-08 MED ORDER — FLUTICASONE PROPIONATE 50 MCG/ACT NA SUSP: 2.0000 | Freq: Every day | NASAL | 1 refills | Status: DC

## 2017-04-08 MED ORDER — PREDNISONE 5 MG PO TABS: 5.0000 mg | ORAL_TABLET | ORAL | 0 refills | Status: DC

## 2017-04-08 MED ORDER — BENZONATATE 100 MG PO CAPS: 100.0000 mg | ORAL_CAPSULE | Freq: Three times a day (TID) | ORAL | 0 refills | Status: DC | PRN

## 2017-04-08 MED FILL — BENZONATATE 100 MG CAPS: 100 | 5 days supply | Qty: 30 | Fill #0

## 2017-04-08 MED FILL — predniSONE 5 MG TABS: 5 | 6 days supply | Qty: 21 | Fill #0

## 2017-04-08 MED FILL — FLUTICASONE PROP 50 MCG SPR: 50 | 30 days supply | Qty: 16 | Fill #0

## 2017-04-08 NOTE — Progress Notes (Signed)
Thank you for the details you included in the comment boxes. Those details are very helpful in determining the best course of treatment for you and help [place] to provide the best care.  We are sorry you are not feeling well.  Here is how we plan to help!  Based on what you have shared with me, it looks like you may have a viral upper respiratory infection or a "common cold".  Colds are caused by a large number of viruses; however, rhinovirus is the most common cause.   Symptoms of the common cold vary from person to person, with common symptoms including sore throat, cough, and malaise.  A low-grade fever of 100.4 may present, but is often uncommon.  Symptoms vary however, and are closely related to a person's age or underlying illnesses.  The most common symptoms associated with the common cold are nasal discharge or congestion, cough, sneezing, headache and pressure in the ears and face.  Cold symptoms usually persist for about 3 to 10 days, but can last up to 2 weeks.  It is important to know that colds do not cause serious illness or complications in most cases.    The common cold is transmitted from person to person, with the most common method of transmission being a person's hands.  The virus is able to live on the skin and can infect other persons for up to 2 hours after direct contact.  Also, colds are transmitted when someone coughs or sneezes; thus, it is important to cover the mouth to reduce this risk.  To keep the spread of the common cold at [place], good hand hygiene is very important.  This is an infection that is most likely caused by a virus. There are no specific treatments for the common cold other than to help you with the symptoms until the infection runs its course.    For nasal congestion, you may use an oral decongestants such as Mucinex D or if you have glaucoma or high blood pressure use plain Mucinex.  Saline nasal spray or nasal drops can help and can safely be used as often as  needed for congestion.  For your congestion, I have prescribed Fluticasone nasal spray one spray in each nostril twice a day  If you do not have a history of heart disease, hypertension, diabetes or thyroid disease, prostate/bladder issues or glaucoma, you may also use Sudafed to treat nasal congestion.  It is highly recommended that you consult with a pharmacist or your primary care physician to ensure this medication is safe for you to take.     If you have a cough, you may use cough suppressants such as Delsym and Robitussin.  If you have glaucoma or high blood pressure, you can also use Coricidin HBP.   For cough I have prescribed for you A prescription cough medication called Tessalon Perles 100 mg. You may take 1-2 capsules every 8 hours as needed for cough   In addition to a steroid pack taper.   If you have a sore or scratchy throat, use a saltwater gargle-  to  teaspoon of salt dissolved in a 4-ounce to 8-ounce glass of warm water.  Gargle the solution for approximately 15-30 seconds and then spit.  It is important not to swallow the solution.  You can also use throat lozenges/cough drops and Chloraseptic spray to help with throat pain or discomfort.  Warm or cold liquids can also be helpful in relieving throat pain.  For headache,  pain or general discomfort, you can use Ibuprofen or Tylenol as directed.   Some authorities believe that zinc sprays or the use of Echinacea may shorten the course of your symptoms.   HOME CARE . Only take medications as instructed by your medical team. . Be sure to drink plenty of fluids. Water is fine as well as fruit juices, sodas and electrolyte beverages. You may want to stay away from caffeine or alcohol. If you are nauseated, try taking small sips of liquids. How do you know if you are getting enough fluid? Your urine should be a pale yellow or almost colorless. . Get rest. . Taking a steamy shower or using a humidifier may help nasal congestion and  ease sore throat pain. You can place a towel over your head and breathe in the steam from hot water coming from a faucet. . Using a saline nasal spray works much the same way. . Cough drops, hard candies and sore throat lozenges may ease your cough. . Avoid close contacts especially the very young and the elderly . Cover your mouth if you cough or sneeze . Always remember to wash your hands.   GET HELP RIGHT AWAY IF: . You develop worsening fever. . If your symptoms do not improve within 10 days . You become short of breath. . You develop yellow or green discharge from your nose over 3 days. . You have coughing fits . You develop a severe head ache or visual changes. . You develop shortness of breath or difficulty breathing. . Your symptoms persist after you have completed your treatment plan  MAKE SURE YOU   Understand these instructions.  Will watch your condition.  Will get help right away if you are not doing well or get worse.  Your e-visit answers were reviewed by a board certified advanced clinical practitioner to complete your personal care plan. Depending upon the condition, your plan could have included both over the counter or prescription medications. Please review your pharmacy choice. If there is a problem, you may call our nursing hot line at and have the prescription routed to another pharmacy. Your safety is important to [place]. If you have drug allergies check your prescription carefully.   You can use MyChart to ask questions about today's visit, request a non-urgent call back, or ask for a work or school excuse for 24 hours related to this e-Visit. If it has been greater than 24 hours you will need to follow up with your provider, or enter a new e-Visit to address those concerns. You will get an e-mail in the next two days asking about your experience.  I hope that your e-visit has been valuable and will speed your recovery. Thank you for using e-visits.

## 2017-04-23 ENCOUNTER — Other Ambulatory Visit: Payer: Self-pay | Admitting: Family Medicine

## 2017-04-23 DIAGNOSIS — F909 Attention-deficit hyperactivity disorder, unspecified type: Secondary | ICD-10-CM

## 2017-04-23 MED ORDER — AMPHETAMINE-DEXTROAMPHETAMINE 20 MG PO TABS: 20.0000 mg | ORAL_TABLET | Freq: Two times a day (BID) | ORAL | 0 refills | Status: DC

## 2017-04-25 MED FILL — NORETHINDRONE 5 MG TABLET: 5 | 90 days supply | Qty: 90 | Fill #1

## 2017-04-25 MED FILL — AMPHETAMINE-DEXTRO 20MG: 20 | 90 days supply | Qty: 180 | Fill #0

## 2017-04-28 ENCOUNTER — Other Ambulatory Visit: Payer: Self-pay | Admitting: Obstetrics & Gynecology

## 2017-04-28 DIAGNOSIS — D225 Melanocytic nevi of trunk: Secondary | ICD-10-CM | POA: Diagnosis not present

## 2017-04-28 DIAGNOSIS — L821 Other seborrheic keratosis: Secondary | ICD-10-CM | POA: Diagnosis not present

## 2017-04-28 MED ORDER — DOXYCYCLINE HYCLATE 100 MG PO CAPS: 100.0000 mg | ORAL_CAPSULE | Freq: Two times a day (BID) | ORAL | 0 refills | Status: DC

## 2017-04-28 NOTE — Progress Notes (Signed)
Pt had acute sinusitis and got better.  Pt was well for 2 days and relapsed.  Sinus pressure, headache.  NO fever.    Doxycycline 100 mg bid (PCN allergy);  If not better, pt to see PCP in 3-5 days.

## 2017-05-05 ENCOUNTER — Ambulatory Visit: Payer: 59 | Admitting: Family Medicine

## 2017-05-13 DIAGNOSIS — H52223 Regular astigmatism, bilateral: Secondary | ICD-10-CM | POA: Diagnosis not present

## 2017-06-02 ENCOUNTER — Other Ambulatory Visit: Payer: Self-pay | Admitting: Family Medicine

## 2017-06-02 DIAGNOSIS — J301 Allergic rhinitis due to pollen: Secondary | ICD-10-CM

## 2017-06-02 MED ORDER — MONTELUKAST SODIUM 10 MG PO TABS: 10.0000 mg | ORAL_TABLET | Freq: Every day | ORAL | 2 refills | Status: DC

## 2017-06-02 MED FILL — MONTELUKAST SOD 10 MG TAB: 10 | 90 days supply | Qty: 90 | Fill #0

## 2017-06-02 NOTE — Progress Notes (Unsigned)
Patient with worsening allergy symptoms despite daily flonase and daily anti-histamine with Allegra--will add singulair.

## 2017-06-16 ENCOUNTER — Other Ambulatory Visit: Payer: Self-pay | Admitting: Obstetrics & Gynecology

## 2017-06-16 MED ORDER — CYCLOBENZAPRINE HCL 10 MG PO TABS: 10.0000 mg | ORAL_TABLET | Freq: Three times a day (TID) | ORAL | 3 refills | Status: DC | PRN

## 2017-06-16 MED FILL — CYCLOBENZAPRINE HCL 10 MG T: 10 | 20 days supply | Qty: 60 | Fill #0

## 2017-06-23 ENCOUNTER — Encounter: Payer: Self-pay | Admitting: Family Medicine

## 2017-06-28 ENCOUNTER — Ambulatory Visit: Payer: 59 | Admitting: Family Medicine

## 2017-07-19 ENCOUNTER — Other Ambulatory Visit: Payer: Self-pay | Admitting: *Deleted

## 2017-07-19 DIAGNOSIS — Z01419 Encounter for gynecological examination (general) (routine) without abnormal findings: Secondary | ICD-10-CM

## 2017-07-19 NOTE — Progress Notes (Signed)
[facility] [address] [place], [place] [address] Phone: [phone] Subjective:    I'm seeing this patient by the request  of:    CC: back pain   CHY:[id number]  Rebecca Maxwell is a 42 y.o. female coming in with complaint of back pain.  Been doing well. Rates severity pain 5/10 patient has seen me previously and has responded well to manipulation.  Patient will be moving out of the state in the next 3 weeks.  Significant other manipulation could be beneficial.  Taking ibuprofen and Tylenol for it from time to time    Past Medical History:  Diagnosis Date  . ADHD (attention deficit hyperactivity disorder)   . Endometriosis STAGE 3  . GERD (gastroesophageal reflux disease)   . IUD (intrauterine device) in place 05/12/2015   Placed 04/10/15  . 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 ANKLE RECONSTRUCTION Bilateral 1997  . CHOLECYSTECTOMY    . Uterine ablation    . WISDOM TOOTH EXTRACTION Bilateral    Social History   Socioeconomic History  . Marital status: Married    Spouse name: Not on file  . Number of children: Not on file  . Years of education: Not on file  . Highest education level: Not on file  Occupational History  . Occupation: CMA    Employer: [employer]  . Financial resource strain: Not on file  . Food insecurity:    Worry: Not on file    Inability: Not on file  . Transportation needs:    Medical: Not on file    Non-medical: Not on file  Tobacco Use  . Smoking status: Current Some Day Smoker    Packs/day: 0.50    Years: 10.00    Pack years: 5.00    Types: Cigarettes    Last attempt to quit: 05/30/2012    Years since quitting: 5.1  . Smokeless tobacco: Never Used  . Tobacco comment: 1/2 pack every 3 days  Substance and Sexual Activity  . Alcohol use: No    Alcohol/week: 0.0 oz  . Drug  use: Not on file  . Sexual activity: Yes  Lifestyle  . Physical activity:    Days per week: Not on file    Minutes per session: Not on file  . Stress: Not on file  Relationships  . Social connections:    Talks on phone: Not on file    Gets together: Not on file    Attends religious service: Not on file    Active member of club or organization: Not on file    Attends meetings of clubs or organizations: Not on file    Relationship status: Not on file  Other Topics Concern  . Not on file  Social History Narrative  . Not on file   Allergies  Allergen Reactions  . Norflex [Orphenadrine]     CAUSES HEADACHES  . Other Other (See Comments)    Oral Pain Medications  . Penicillins Nausea And Vomiting  . Tramadol-Acetaminophen Anxiety   Family History  Problem Relation Age of Onset  . Diabetes Mother   . Hyperlipidemia Mother   . Hypertension Mother   . Diabetes Sister   . Depression Sister   . Alcohol abuse Father   . Alcohol abuse Maternal Grandmother   . Heart attack Maternal Grandmother   .  Stroke Maternal Grandmother   . Cancer Paternal Grandmother        colon     Past medical history, social, surgical and family history all reviewed in electronic medical record.  No pertanent information unless stated regarding to the chief complaint.   Review of Systems:Review of systems updated and as accurate as of 07/21/17  No headache, visual changes, nausea, vomiting, diarrhea, constipation, dizziness, abdominal pain, skin rash, fevers, chills, night sweats, weight loss, swollen lymph nodes, body aches, joint swelling,  chest pain, shortness of breath, mood changes. +muscle aches   Objective  Blood pressure 110/80, pulse (!) 103, height 5\' 5"  (1.651 m), SpO2 98 %. Systems examined below as of 07/21/17   General: No apparent distress alert and oriented x3 mood and affect normal, dressed appropriately.  HEENT: Pupils equal, extraocular movements intact  Respiratory: Patient's  speak in full sentences and does not appear short of breath  Cardiovascular: No lower extremity edema, non tender, no erythema  Skin: Warm dry intact with no signs of infection or rash on extremities or on axial skeleton.  Abdomen: Soft nontender  Neuro: Cranial nerves II through XII are intact, neurovascularly intact in all extremities with 2+ DTRs and 2+ pulses.  Lymph: No lymphadenopathy of posterior or anterior cervical chain or axillae bilaterally.  Gait normal with good balance and coordination.  MSK:  Non tender with full range of motion and good stability and symmetric strength and tone of shoulders, elbows, wrist, hip, knee and ankles bilaterally.  Back Exam:  Inspection: Unremarkable  Motion: Flexion 35 deg, Extension 25 deg, Side Bending to 30 deg bilaterally,  Rotation to 25 deg bilaterally  SLR laying: Negative  XSLR laying: Negative  Palpable tenderness: Positive tenderness in the paraspinal musculature lumbar spine. FABER: Positive Faber on the right. Sensory change: Gross sensation intact to all lumbar and sacral dermatomes.  Reflexes: 2+ at both patellar tendons, 2+ at achilles tendons, Babinski's downgoing.  Strength at foot  Plantar-flexion: 5/5 Dorsi-flexion: 5/5 Eversion: 5/5 Inversion: 5/5  Leg strength  Quad: 5/5 Hamstring: 5/5 Hip flexor: 5/5 Hip abductors: 4/5  Gait unremarkable.  Osteopathic findings C6 flexed rotated and side bent left T3 extended rotated and side bent right inhaled third rib L2 flexed rotated and side bent right Sacrum right on right    Impression and Recommendations:     This case required medical decision making of moderate complexity.      Note: This dictation was prepared with Dragon dictation along with smaller phrase technology. Any transcriptional errors that result from this process are unintentional.

## 2017-07-21 ENCOUNTER — Ambulatory Visit (INDEPENDENT_AMBULATORY_CARE_PROVIDER_SITE_OTHER): Payer: 59 | Admitting: Family Medicine

## 2017-07-21 ENCOUNTER — Other Ambulatory Visit (HOSPITAL_COMMUNITY)
Admission: RE | Admit: 2017-07-21 | Discharge: 2017-07-21 | Disposition: A | Discharge status: Home / Self Care | Payer: 59 | Source: Ambulatory Visit | Attending: Family Medicine | Admitting: Family Medicine

## 2017-07-21 ENCOUNTER — Encounter: Payer: Self-pay | Admitting: Family Medicine

## 2017-07-21 ENCOUNTER — Other Ambulatory Visit: Payer: Self-pay

## 2017-07-21 VITALS — BP 100/70 | HR 91 | Temp 99.0°F | Ht 65.0 in | Wt 238.0 lb

## 2017-07-21 VITALS — BP 110/80 | HR 103 | Ht 65.0 in

## 2017-07-21 DIAGNOSIS — Z124 Encounter for screening for malignant neoplasm of cervix: Secondary | ICD-10-CM | POA: Insufficient documentation

## 2017-07-21 DIAGNOSIS — M999 Biomechanical lesion, unspecified: Secondary | ICD-10-CM | POA: Diagnosis not present

## 2017-07-21 DIAGNOSIS — J301 Allergic rhinitis due to pollen: Secondary | ICD-10-CM

## 2017-07-21 DIAGNOSIS — M48062 Spinal stenosis, lumbar region with neurogenic claudication: Secondary | ICD-10-CM | POA: Diagnosis not present

## 2017-07-21 DIAGNOSIS — N803 Endometriosis of pelvic peritoneum, unspecified: Secondary | ICD-10-CM

## 2017-07-21 DIAGNOSIS — R21 Rash and other nonspecific skin eruption: Secondary | ICD-10-CM

## 2017-07-21 DIAGNOSIS — F909 Attention-deficit hyperactivity disorder, unspecified type: Secondary | ICD-10-CM

## 2017-07-21 DIAGNOSIS — J029 Acute pharyngitis, unspecified: Secondary | ICD-10-CM

## 2017-07-21 DIAGNOSIS — Z01419 Encounter for gynecological examination (general) (routine) without abnormal findings: Secondary | ICD-10-CM

## 2017-07-21 LAB — COMPREHENSIVE METABOLIC PANEL
AG RATIO: 1.4 (calc) (ref 1.0–2.5)
ALT: 12 U/L (ref 6–29)
AST: 14 U/L (ref 10–30)
Albumin: 3.8 g/dL (ref 3.6–5.1)
Alkaline phosphatase (APISO): 81 U/L (ref 33–115)
BUN: 11 mg/dL (ref 7–25)
CO2: 25 mmol/L (ref 20–32)
CREATININE: 0.7 mg/dL (ref 0.50–1.10)
Calcium: 9.1 mg/dL (ref 8.6–10.2)
Chloride: 105 mmol/L (ref 98–110)
GLUCOSE: 112 mg/dL — AB (ref 65–99)
Globulin: 2.8 g/dL (calc) (ref 1.9–3.7)
Potassium: 4.6 mmol/L (ref 3.5–5.3)
SODIUM: 139 mmol/L (ref 135–146)
TOTAL PROTEIN: 6.6 g/dL (ref 6.1–8.1)
Total Bilirubin: 0.4 mg/dL (ref 0.2–1.2)

## 2017-07-21 LAB — HEMOGLOBIN A1C
EAG (MMOL/L): 6.2 (calc)
HEMOGLOBIN A1C: 5.5 %{Hb} (ref <5.7)
MEAN PLASMA GLUCOSE: 111 (calc)

## 2017-07-21 LAB — CBC
HCT: 41.3 % (ref 35.0–45.0)
HEMOGLOBIN: 13.9 g/dL (ref 11.7–15.5)
MCH: 31 pg (ref 27.0–33.0)
MCHC: 33.7 g/dL (ref 32.0–36.0)
MCV: 92.2 fL (ref 80.0–100.0)
MPV: 11.2 fL (ref 7.5–12.5)
PLATELETS: 351 10*3/uL (ref 140–400)
RBC: 4.48 10*6/uL (ref 3.80–5.10)
RDW: 12.2 % (ref 11.0–15.0)
WBC: 10.3 10*3/uL (ref 3.8–10.8)

## 2017-07-21 LAB — LIPID PANEL
Cholesterol: 169 mg/dL (ref <200)
HDL: 42 mg/dL — AB (ref >50)
LDL Cholesterol (Calc): 108 mg/dL (calc) — ABNORMAL HIGH
Non-HDL Cholesterol (Calc): 127 mg/dL (calc) (ref <130)
Total CHOL/HDL Ratio: 4 (calc) (ref <5.0)
Triglycerides: 93 mg/dL (ref <150)

## 2017-07-21 LAB — TEST AUTHORIZATION

## 2017-07-21 LAB — TSH: TSH: 2.12 m[IU]/L

## 2017-07-21 LAB — VITAMIN D 25 HYDROXY (VIT D DEFICIENCY, FRACTURES): VIT D 25 HYDROXY: 22 ng/mL — AB (ref 30–100)

## 2017-07-21 MED ORDER — CYCLOBENZAPRINE HCL 10 MG PO TABS: 10.0000 mg | ORAL_TABLET | Freq: Three times a day (TID) | ORAL | 3 refills | Status: AC | PRN

## 2017-07-21 MED ORDER — HYDROCORTISONE 2.5 % EX OINT: TOPICAL_OINTMENT | Freq: Two times a day (BID) | CUTANEOUS | 3 refills | Status: AC

## 2017-07-21 MED ORDER — MUPIROCIN 2 % EX OINT: 1.0000 "application " | TOPICAL_OINTMENT | Freq: Two times a day (BID) | CUTANEOUS | 3 refills | Status: AC

## 2017-07-21 MED ORDER — NORETHINDRONE ACETATE 5 MG PO TABS: 5.0000 mg | ORAL_TABLET | Freq: Every day | ORAL | 2 refills | Status: DC | PRN

## 2017-07-21 MED ORDER — MONTELUKAST SODIUM 10 MG PO TABS: 10.0000 mg | ORAL_TABLET | Freq: Every day | ORAL | 2 refills | Status: AC

## 2017-07-21 MED ORDER — FLUTICASONE PROPIONATE 50 MCG/ACT NA SUSP: 2.0000 | Freq: Every day | NASAL | 3 refills | Status: AC

## 2017-07-21 MED ORDER — AMPHETAMINE-DEXTROAMPHETAMINE 20 MG PO TABS: 20.0000 mg | ORAL_TABLET | Freq: Two times a day (BID) | ORAL | 0 refills | Status: DC

## 2017-07-21 MED ORDER — VITAMIN D3 50 MCG (2000 UT) PO TABS: 1.0000 | ORAL_TABLET | Freq: Every day | ORAL | 3 refills | Status: AC

## 2017-07-21 MED ORDER — NYSTATIN-TRIAMCINOLONE 100000-0.1 UNIT/GM-% EX OINT: 1.0000 "application " | TOPICAL_OINTMENT | Freq: Two times a day (BID) | CUTANEOUS | 3 refills | Status: AC

## 2017-07-21 MED FILL — HYDROCORTISONE 2.5% OINT: 2.5 | 10 days supply | Qty: 28 | Fill #0

## 2017-07-21 MED FILL — NYSTATIN-TRIAMCINOLONE OINT: 100000-0.1 | 10 days supply | Qty: 30 | Fill #0

## 2017-07-21 MED FILL — CYCLOBENZAPRINE 10 MG TAB: 10 | 30 days supply | Qty: 90 | Fill #0

## 2017-07-21 MED FILL — FLUTICASONE PROP 50 MCG SPR: 50 | 90 days supply | Qty: 48 | Fill #0

## 2017-07-21 MED FILL — MUPIROCIN 2% OINTMENT: 2 | 10 days supply | Qty: 22 | Fill #0

## 2017-07-21 MED FILL — NORETHINDRONE 5 MG TABLET: 5 | 90 days supply | Qty: 90 | Fill #0

## 2017-07-21 NOTE — Assessment & Plan Note (Signed)
Patient does have some loss of her normal stenosis.  Discussed icing regimen and home exercises.  Discussed which activities to do which wants to avoid.  Follow-up again in 4 weeks.

## 2017-07-21 NOTE — Assessment & Plan Note (Signed)
Decision today to treat with OMT was based on Physical Exam  After verbal consent patient was treated with HVLA, ME, FPR techniques in cervical, thoracic, lumbar and sacral areas  Patient tolerated the procedure well with improvement in symptoms  Patient given exercises, stretches and lifestyle modifications  See medications in patient instructions if given  Patient will follow up in 12 weeks the patient is moving.

## 2017-07-21 NOTE — Patient Instructions (Signed)
Good to see you  Alvera Singh is your friend.  HAve a

## 2017-07-21 NOTE — Patient Instructions (Signed)
Preventive Care 18-39 Years, Female Preventive care refers to lifestyle choices and visits with your health care provider that can promote health and wellness. What does preventive care include?  A yearly physical exam. This is also called an annual well check.  Dental exams once or twice a year.  Routine eye exams. Ask your health care provider how often you should have your eyes checked.  Personal lifestyle choices, including: ? Daily care of your teeth and gums. ? Regular physical activity. ? Eating a healthy diet. ? Avoiding tobacco and drug use. ? Limiting alcohol use. ? Practicing safe sex. ? Taking vitamin and mineral supplements as recommended by your health care provider. What happens during an annual well check? The services and screenings done by your health care provider during your annual well check will depend on your age, overall health, lifestyle risk factors, and family history of disease. Counseling Your health care provider may ask you questions about your:  Alcohol use.  Tobacco use.  Drug use.  Emotional well-being.  Home and relationship well-being.  Sexual activity.  Eating habits.  Work and work Statistician.  Method of birth control.  Menstrual cycle.  Pregnancy history.  Screening You may have the following tests or measurements:  Height, weight, and BMI.  Diabetes screening. This is done by checking your blood sugar (glucose) after you have not eaten for a while (fasting).  Blood pressure.  Lipid and cholesterol levels. These may be checked every 5 years starting at age 38.  Skin check.  Hepatitis C blood test.  Hepatitis B blood test.  Sexually transmitted disease (STD) testing.  BRCA-related cancer screening. This may be done if you have a family history of breast, ovarian, tubal, or peritoneal cancers.  Pelvic exam and Pap test. This may be done every 3 years starting at age 38. Starting at age 30, this may be done  every 5 years if you have a Pap test in combination with an HPV test.  Discuss your test results, treatment options, and if necessary, the need for more tests with your health care provider. Vaccines Your health care provider may recommend certain vaccines, such as:  Influenza vaccine. This is recommended every year.  Tetanus, diphtheria, and acellular pertussis (Tdap, Td) vaccine. You may need a Td booster every 10 years.  Varicella vaccine. You may need this if you have not been vaccinated.  HPV vaccine. If you are 39 or younger, you may need three doses over 6 months.  Measles, mumps, and rubella (MMR) vaccine. You may need at least one dose of MMR. You may also need a second dose.  Pneumococcal 13-valent conjugate (PCV13) vaccine. You may need this if you have certain conditions and were not previously vaccinated.  Pneumococcal polysaccharide (PPSV23) vaccine. You may need one or two doses if you smoke cigarettes or if you have certain conditions.  Meningococcal vaccine. One dose is recommended if you are age 68-21 years and a first-year college student living in a residence hall, or if you have one of several medical conditions. You may also need additional booster doses.  Hepatitis A vaccine. You may need this if you have certain conditions or if you travel or work in places where you may be exposed to hepatitis A.  Hepatitis B vaccine. You may need this if you have certain conditions or if you travel or work in places where you may be exposed to hepatitis B.  Haemophilus influenzae type b (Hib) vaccine. You may need this  if you have certain risk factors.  Talk to your health care provider about which screenings and vaccines you need and how often you need them. This information is not intended to replace advice given to you by your health care provider. Make sure you discuss any questions you have with your health care provider. Document Released: 03/30/2001 Document Revised:  10/22/2015 Document Reviewed: 12/03/2014 Elsevier Interactive Patient Education  2018 Elsevier Inc.  

## 2017-07-21 NOTE — Progress Notes (Signed)
Subjective:     Rebecca Maxwell is a 42 y.o. female and is here for a comprehensive physical exam. The patient reports no problems.  Social History   Socioeconomic History  . Marital status: Married    Spouse name: Not on file  . Number of children: Not on file  . Years of education: Not on file  . Highest education level: Not on file  Occupational History  . Occupation: CMA    Employer: [employer]  . Financial resource strain: Not on file  . Food insecurity:    Worry: Not on file    Inability: Not on file  . Transportation needs:    Medical: Not on file    Non-medical: Not on file  Tobacco Use  . Smoking status: Current Some Day Smoker    Packs/day: 0.50    Years: 10.00    Pack years: 5.00    Types: Cigarettes    Last attempt to quit: 05/30/2012    Years since quitting: 5.1  . Smokeless tobacco: Never Used  . Tobacco comment: 1/2 pack every 3 days  Substance and Sexual Activity  . Alcohol use: No    Alcohol/week: 0.0 oz  . Drug use: Not on file  . Sexual activity: Yes  Lifestyle  . Physical activity:    Days per week: Not on file    Minutes per session: Not on file  . Stress: Not on file  Relationships  . Social connections:    Talks on phone: Not on file    Gets together: Not on file    Attends religious service: Not on file    Active member of club or organization: Not on file    Attends meetings of clubs or organizations: Not on file    Relationship status: Not on file  . Intimate partner violence:    Fear of current or ex partner: Not on file    Emotionally abused: Not on file    Physically abused: Not on file    Forced sexual activity: Not on file  Other Topics Concern  . Not on file  Social History Narrative  . Not on file   Health Maintenance  Topic Date Due  . HIV Screening  09/14/1992  . INFLUENZA VACCINE  09/15/2017  . PAP SMEAR  01/22/2018  . TETANUS/TDAP  09/18/2023    The following portions of the patient's history  were reviewed and updated as appropriate: allergies, current medications, past family history, past medical history, past social history, past surgical history and problem list.  Review of Systems Pertinent items noted in HPI and remainder of comprehensive ROS otherwise negative.   Objective:    BP 100/70   Pulse 91   Temp 99 F (37.2 C) (Oral)   Ht 5\' 5"  (1.651 m)   Wt 238 lb (108 kg)   SpO2 98%   BMI 39.61 kg/m  General appearance: alert, cooperative and appears stated age Head: Normocephalic, without obvious abnormality, atraumatic Neck: no adenopathy, supple, symmetrical, trachea midline and thyroid not enlarged, symmetric, no tenderness/mass/nodules Lungs: clear to auscultation bilaterally Heart: regular rate and rhythm, S1, S2 normal, no murmur, click, rub or gallop Abdomen: soft, non-tender; bowel sounds normal; no masses,  no organomegaly Pelvic: cervix normal in appearance, external genitalia normal, no adnexal masses or tenderness, no cervical motion tenderness, uterus normal size, shape, and consistency and vagina normal without discharge Extremities: extremities normal, atraumatic, no cyanosis or edema Skin: Skin color, texture, turgor normal.  No rashes or lesions Neurologic: Grossly normal    Assessment:    Healthy female exam.      Plan:   Problem List Items Addressed This Visit      Unprioritized   ADHD (attention deficit hyperactivity disorder)   Relevant Medications   amphetamine-dextroamphetamine (ADDERALL) 20 MG tablet   Pelvic peritoneal endometriosis   Relevant Medications   norethindrone (AYGESTIN) 5 MG tablet    Other Visit Diagnoses    Encounter for gynecological examination without abnormal finding    -  Primary   Relevant Orders   MM DIGITAL SCREENING BILATERAL   Screening for cervical cancer       Relevant Orders   Cytology - PAP   Rash       Relevant Medications   nystatin-triamcinolone ointment (MYCOLOG)   mupirocin ointment  (BACTROBAN) 2 %   Seasonal allergic rhinitis due to pollen       Relevant Medications   montelukast (SINGULAIR) 10 MG tablet   fluticasone (FLONASE) 50 MCG/ACT nasal spray   Viral pharyngitis       Relevant Medications   nystatin-triamcinolone ointment (MYCOLOG)   mupirocin ointment (BACTROBAN) 2 %     Refilled meds for 90 days Mammogram as she is moving to [place] and will not be able to get one soon and turning 42 in 1 month.  Return in 1 year (on 07/22/2018).    See After Visit Summary for Counseling Recommendations

## 2017-07-22 MED FILL — DEXTROAMP-AMPHETAMIN 20 MG: 20 | 90 days supply | Qty: 180 | Fill #0

## 2017-07-25 LAB — CYTOLOGY - PAP
Diagnosis: NEGATIVE
HPV: NOT DETECTED

## 2017-07-28 ENCOUNTER — Ambulatory Visit (HOSPITAL_COMMUNITY)
Admission: EM | Admit: 2017-07-28 | Discharge: 2017-07-28 | Disposition: A | Discharge status: Home / Self Care | Payer: 59

## 2017-07-28 ENCOUNTER — Ambulatory Visit: Payer: Self-pay | Admitting: Family

## 2017-07-28 VITALS — BP 124/82 | HR 105 | Temp 98.9°F | Resp 20 | Wt 239.8 lb

## 2017-07-28 DIAGNOSIS — R05 Cough: Secondary | ICD-10-CM

## 2017-07-28 DIAGNOSIS — R059 Cough, unspecified: Secondary | ICD-10-CM

## 2017-07-28 DIAGNOSIS — Z720 Tobacco use: Secondary | ICD-10-CM

## 2017-07-28 DIAGNOSIS — J069 Acute upper respiratory infection, unspecified: Secondary | ICD-10-CM

## 2017-07-28 DIAGNOSIS — B9689 Other specified bacterial agents as the cause of diseases classified elsewhere: Secondary | ICD-10-CM

## 2017-07-28 MED ORDER — PROMETHAZINE-PHENYLEPHRINE 6.25-5 MG/5ML PO SYRP: 5.0000 mL | ORAL_SOLUTION | Freq: Four times a day (QID) | ORAL | 0 refills | Status: DC | PRN

## 2017-07-28 MED ORDER — AZITHROMYCIN 250 MG PO TABS: ORAL_TABLET | ORAL | 0 refills | Status: DC

## 2017-07-28 NOTE — Patient Instructions (Signed)
Upper Respiratory Infection, Adult Most upper respiratory infections (URIs) are a viral infection of the air passages leading to the lungs. A URI affects the nose, throat, and upper air passages. The most common type of URI is nasopharyngitis and is typically referred to as "the common cold." URIs run their course and usually go away on their own. Most of the time, a URI does not require medical attention, but sometimes a bacterial infection in the upper airways can follow a viral infection. This is called a secondary infection. Sinus and middle ear infections are common types of secondary upper respiratory infections. Bacterial pneumonia can also complicate a URI. A URI can worsen asthma and chronic obstructive pulmonary disease (COPD). Sometimes, these complications can require emergency medical care and may be life threatening. What are the causes? Almost all URIs are caused by viruses. A virus is a type of germ and can spread from one person to another. What increases the risk? You may be at risk for a URI if:  You smoke.  You have chronic heart or lung disease.  You have a weakened defense (immune) system.  You are very young or very old.  You have nasal allergies or asthma.  You work in crowded or poorly ventilated areas.  You work in health care facilities or schools.  What are the signs or symptoms? Symptoms typically develop 2-3 days after you come in contact with a cold virus. Most viral URIs last 7-10 days. However, viral URIs from the influenza virus (flu virus) can last 14-18 days and are typically more severe. Symptoms may include:  Runny or stuffy (congested) nose.  Sneezing.  Cough.  Sore throat.  Headache.  Fatigue.  Fever.  Loss of appetite.  Pain in your forehead, behind your eyes, and over your cheekbones (sinus pain).  Muscle aches.  How is this diagnosed? Your health care provider may diagnose a URI by:  Physical exam.  Tests to check that your  symptoms are not due to another condition such as: ? Strep throat. ? Sinusitis. ? Pneumonia. ? Asthma.  How is this treated? A URI goes away on its own with time. It cannot be cured with medicines, but medicines may be prescribed or recommended to relieve symptoms. Medicines may help:  Reduce your fever.  Reduce your cough.  Relieve nasal congestion.  Follow these instructions at home:  Take medicines only as directed by your health care provider.  Gargle warm saltwater or take cough drops to comfort your throat as directed by your health care provider.  Use a warm mist humidifier or inhale steam from a shower to increase air moisture. This may make it easier to breathe.  Drink enough fluid to keep your urine clear or pale yellow.  Eat soups and other clear broths and maintain good nutrition.  Rest as needed.  Return to work when your temperature has returned to normal or as your health care provider advises. You may need to stay home longer to avoid infecting others. You can also use a face mask and careful hand washing to prevent spread of the virus.  Increase the usage of your inhaler if you have asthma.  Do not use any tobacco products, including cigarettes, chewing tobacco, or electronic cigarettes. If you need help quitting, ask your health care provider. How is this prevented? The best way to protect yourself from getting a cold is to practice good hygiene.  Avoid oral or hand contact with people with cold symptoms.  Wash your   hands often if contact occurs.  There is no clear evidence that vitamin C, vitamin E, echinacea, or exercise reduces the chance of developing a cold. However, it is always recommended to get plenty of rest, exercise, and practice good nutrition. Contact a health care provider if:  You are getting worse rather than better.  Your symptoms are not controlled by medicine.  You have chills.  You have worsening shortness of breath.  You have  brown or red mucus.  You have yellow or brown nasal discharge.  You have pain in your face, especially when you bend forward.  You have a fever.  You have swollen neck glands.  You have pain while swallowing.  You have white areas in the back of your throat. Get help right away if:  You have severe or persistent: ? Headache. ? Ear pain. ? Sinus pain. ? Chest pain.  You have chronic lung disease and any of the following: ? Wheezing. ? Prolonged cough. ? Coughing up blood. ? A change in your usual mucus.  You have a stiff neck.  You have changes in your: ? Vision. ? Hearing. ? Thinking. ? Mood. This information is not intended to replace advice given to you by your health care provider. Make sure you discuss any questions you have with your health care provider. Document Released: 07/28/2000 Document Revised: 10/05/2015 Document Reviewed: 05/09/2013 Elsevier Interactive Patient Education  2018 [employer].    Tobacco Use Disorder Tobacco use disorder (TUD) is a mental disorder. It is the long-term use of tobacco in spite of related health problems or difficulty with normal life activities. Tobacco is most commonly smoked as cigarettes and less commonly as cigars or pipes. Smokeless chewing tobacco and snuff are also popular. People with TUD get a feeling of extreme pleasure (euphoria) from using tobacco and have a desire to use it again and again. Repeated use of tobacco can cause problems. The addictive effects of tobacco are due mainly tothe ingredient nicotine. Nicotine also causes a rush of adrenaline (epinephrine) in the body. This leads to increased blood pressure, heart rate, and breathing rate. These changes may cause problems for people with high blood pressure, weak hearts, or lung disease. High doses of nicotine in children and pets can lead to seizures and death. Tobacco contains a number of other unsafe chemicals. These chemicals are especially harmful when  inhaled as smoke and can damage almost every organ in the body. Smokers live shorter lives than nonsmokers and are at risk of dying from a number of diseases and cancers. Tobacco smoke can also cause health problems for nonsmokers (due to inhaling secondhand smoke). Smoking is also a fire hazard. TUD usually starts in the late teenage years and is most common in young adults between the ages of 39 and 41 years. People who start smoking earlier in life are more likely to continue smoking as adults. TUD is somewhat more common in men than women. People with TUD are at higher risk for using alcohol and other drugs of abuse. What increases the risk? Risk factors for TUD include:  Having family members with the disorder.  Being around people who use tobacco.  Having an existing mental health issue such as schizophrenia, depression, bipolar disorder, ADHD, or posttraumatic stress disorder (PTSD).  What are the signs or symptoms? People with tobacco use disorder have two or more of the following signs and symptoms within 12 months:  Use of more tobacco over a longer period than intended.  Not able to cut down or control tobacco use.  A lot of time spent obtaining or using tobacco.  Strong desire or urge to use tobacco (craving). Cravings may last for 6 months or longer after quitting.  Use of tobacco even when use leads to major problems at work, school, or home.  Use of tobacco even when use leads to relationship problems.  Giving up or cutting down on important life activities because of tobacco use.  Repeatedly using tobacco in situations where it puts you or others in physical danger, like smoking in bed.  Use of tobacco even when it is known that a physical or mental problem is likely related to tobacco use. ? Physical problems are numerous and may include chronic bronchitis, emphysema, lung and other cancers, gum disease, high blood pressure, heart disease, and stroke. ? Mental  problems caused by tobacco may include difficulty sleeping and anxiety.  Need to use greater amounts of tobacco to get the same effect. This means you have developed a tolerance.  Withdrawal symptoms as a result of stopping or rapidly cutting back use. These symptoms may last a month or more after quitting and include the following: ? Depressed, anxious, or irritable mood. ? Difficulty concentrating. ? Increased appetite. ? Restlessness or trouble sleeping. ? Use of tobacco to avoid withdrawal symptoms.  How is this diagnosed? Tobacco use disorder is diagnosed by your health care provider. A diagnosis may be made by:  Your health care provider asking questions about your tobacco use and any problems it may be causing.  A physical exam.  Lab tests.  You may be referred to a mental health professional or addiction specialist.  The severity of tobacco use disorder depends on the number of signs and symptoms you have:  Mild-Two or three symptoms.  Moderate-Four or five symptoms.  Severe-Six or more symptoms.  How is this treated? Many people with tobacco use disorder are unable to quit on their own and need help. Treatment options include the following:  Nicotine replacement therapy (NRT). NRT provides nicotine without the other harmful chemicals in tobacco. NRT gradually lowers the dosage of nicotine in the body and reduces withdrawal symptoms. NRT is available in over-the-counter forms (gum, lozenges, and skin patches) as well as prescription forms (mouth inhaler and nasal spray).  Medicines.This may include: ? Antidepressant medicine that may reduce nicotine cravings. ? A medicine that acts on nicotine receptors in the brain to reduce cravings and withdrawal symptoms. It may also block the effects of tobacco in people with TUD who relapse.  Counseling or talk therapy. A form of talk therapy called behavioral therapy is commonly used to treat people with TUD. Behavioral therapy  looks at triggers for tobacco use, how to avoid them, and how to cope with cravings. It is most effective in person or by phone but is also available in self-help forms (books and Internet websites).  Support groups. These provide emotional support, advice, and guidance for quitting tobacco.  The most effective treatment for TUD is usually a combination of medicine, talk therapy, and support groups. Follow these instructions at home:  Keep all follow-up visits as directed by your health care provider. This is important.  Take medicines only as directed by your health care provider.  Check with your health care provider before starting new prescription or over-the-counter medicines. Contact a health care provider if:  You are not able to take your medicines as prescribed.  Treatment is not helping your TUD and your  symptoms get worse. Get help right away if:  You have serious thoughts about hurting yourself or others.  You have trouble breathing, chest pain, sudden weakness, or sudden numbness in part of your body. This information is not intended to replace advice given to you by your health care provider. Make sure you discuss any questions you have with your health care provider. Document Released: 10/08/2003 Document Revised: 10/05/2015 Document Reviewed: 03/30/2013 Elsevier Interactive Patient Education  [employer].

## 2017-07-28 NOTE — Progress Notes (Signed)
Subjective:     Patient ID: Rebecca Maxwell, female   DOB: [birth year], 42 y.o.   MRN: [id number]  HPI 42 year old 1/2ppd smoking female is in today with c/o cough, congestion, sore throat x 5 days and worsening. She denies fever.   Review of Systems  Constitutional: Positive for fatigue and fever.  HENT: Positive for congestion, postnasal drip, sinus pressure and sinus pain. Negative for sneezing.   Eyes: Negative.   Respiratory: Positive for choking. Negative for cough and wheezing.   Cardiovascular: Negative.   Endocrine: Negative.   Musculoskeletal: Negative.   Allergic/Immunologic: Negative.   Neurological: Negative.   Psychiatric/Behavioral: Negative.    Past Medical History:  Diagnosis Date  . ADHD (attention deficit hyperactivity disorder)   . Endometriosis STAGE 3  . GERD (gastroesophageal reflux disease)   . IUD (intrauterine device) in place 05/12/2015   Placed 04/10/15  . Spinal stenosis of lumbar region 03/04/2015   Per ortho records, L4L5 MRI 11/25/14 but asymptomatic     Social History   Socioeconomic History  . Marital status: Married    Spouse name: Not on file  . Number of children: Not on file  . Years of education: Not on file  . Highest education level: Not on file  Occupational History  . Occupation: CMA    Employer: [employer]  . Financial resource strain: Not on file  . Food insecurity:    Worry: Not on file    Inability: Not on file  . Transportation needs:    Medical: Not on file    Non-medical: Not on file  Tobacco Use  . Smoking status: Current Some Day Smoker    Packs/day: 0.50    Years: 10.00    Pack years: 5.00    Types: Cigarettes    Last attempt to quit: 05/30/2012    Years since quitting: 5.1  . Smokeless tobacco: Never Used  . Tobacco comment: 1/2 pack every 3 days  Substance and Sexual Activity  . Alcohol use: No    Alcohol/week: 0.0 oz  . Drug use: Not on file  . Sexual activity: Yes  Lifestyle  .  Physical activity:    Days per week: Not on file    Minutes per session: Not on file  . Stress: Not on file  Relationships  . Social connections:    Talks on phone: Not on file    Gets together: Not on file    Attends religious service: Not on file    Active member of club or organization: Not on file    Attends meetings of clubs or organizations: Not on file    Relationship status: Not on file  . Intimate partner violence:    Fear of current or ex partner: Not on file    Emotionally abused: Not on file    Physically abused: Not on file    Forced sexual activity: Not on file  Other Topics Concern  . Not on file  Social History Narrative  . Not on file    Past Surgical History:  Procedure Laterality Date  . ANKLE RECONSTRUCTION Bilateral 1997  . CHOLECYSTECTOMY    . Uterine ablation    . WISDOM TOOTH EXTRACTION Bilateral     Family History  Problem Relation Age of Onset  . Diabetes Mother   . Hyperlipidemia Mother   . Hypertension Mother   . Diabetes Sister   . Depression Sister   . Alcohol abuse Father   .  Alcohol abuse Maternal Grandmother   . Heart attack Maternal Grandmother   . Stroke Maternal Grandmother   . Cancer Paternal Grandmother        colon    Allergies  Allergen Reactions  . Norflex [Orphenadrine]     CAUSES HEADACHES  . Other Other (See Comments)    Oral Pain Medications  . Penicillins Nausea And Vomiting  . Tramadol-Acetaminophen Anxiety    Current Outpatient Medications on File Prior to Visit  Medication Sig Dispense Refill  . amphetamine-dextroamphetamine (ADDERALL) 20 MG tablet Take 1 tablet (20 mg total) by mouth 2 (two) times daily. FOR 90 DAYS 180 tablet 0  . Cholecalciferol (VITAMIN D3) 2000 units TABS Take 1 tablet by mouth daily. 90 tablet 3  . cyclobenzaprine (FLEXERIL) 10 MG tablet Take 1 tablet (10 mg total) by mouth every 8 (eight) hours as needed for muscle spasms. 90 tablet 3  . fluticasone (FLONASE) 50 MCG/ACT nasal spray  Place 2 sprays into both nostrils daily. 16 g 3  . hydrocortisone 2.5 % ointment Apply topically 2 (two) times daily. 30 g 3  . levonorgestrel (MIRENA) 20 MCG/24HR IUD 1 each by Intrauterine route once.    . montelukast (SINGULAIR) 10 MG tablet Take 1 tablet (10 mg total) by mouth at bedtime. 90 tablet 2  . mupirocin ointment (BACTROBAN) 2 % Apply 1 application topically 2 (two) times daily. 22 g 3  . norethindrone (AYGESTIN) 5 MG tablet Take 1 tablet (5 mg total) by mouth daily as needed. 90 tablet 2  . nystatin-triamcinolone ointment (MYCOLOG) Apply 1 application topically 2 (two) times daily. 30 g 3  . VICTOZA 18 MG/3ML SOPN Inject 0.3 mLs (1.8 mg total) into the skin daily. 6 mL 3   No current facility-administered medications on file prior to visit.     BP 124/82 (BP Location: Right Arm, Patient Position: Sitting, Cuff Size: Normal)   Pulse (!) 105   Temp 98.9 F (37.2 C) (Oral)   Resp 20   Wt 239 lb 12.8 oz (108.8 kg)   SpO2 98%   BMI 39.90 kg/m chart    Objective:   Physical Exam  Constitutional: She appears well-developed and well-nourished.  HENT:  Right Ear: External ear normal.  Left Ear: External ear normal.  Mouth/Throat: Oropharynx is clear and moist.  Neck: Normal range of motion. Neck supple.  Cardiovascular: Normal rate, regular rhythm and normal heart sounds.  Pulmonary/Chest: Effort normal and breath sounds normal.  Musculoskeletal: Normal range of motion.  Skin: Skin is warm and dry.  Psychiatric: She has a normal mood and affect.       Assessment:     Devory was seen today for congestion,cough.  Diagnoses and all orders for this visit:  Bacterial upper respiratory infection  Cough  Tobacco abuse  Other orders -     azithromycin (ZITHROMAX) 250 MG tablet; 2 tabs today, then 1 tab daily x 4 more days -     promethazine-phenylephrine (PROMETHAZINE-PHENYLEPHRINE) 6.25-5 MG/5ML SYRP; Take 5 mLs by mouth every 6 (six) hours as needed for  congestion.      Plan:     Call the office with any questions or concerns. Recheck as needed

## 2017-08-02 ENCOUNTER — Other Ambulatory Visit: Payer: Self-pay | Admitting: Family Medicine

## 2017-08-02 MED ORDER — HYDROCOD POLST-CPM POLST ER 10-8 MG/5ML PO SUER: 5.0000 mL | Freq: Two times a day (BID) | ORAL | 0 refills | Status: AC | PRN

## 2017-08-02 MED FILL — HYDROCODONE-CHLORPHEN ER SU: 10-8 | 14 days supply | Qty: 140 | Fill #0

## 2017-08-15 ENCOUNTER — Other Ambulatory Visit: Payer: Self-pay | Admitting: Family Medicine

## 2017-08-15 DIAGNOSIS — Z1231 Encounter for screening mammogram for malignant neoplasm of breast: Secondary | ICD-10-CM

## 2017-10-27 ENCOUNTER — Other Ambulatory Visit: Payer: Self-pay | Admitting: Family Medicine

## 2017-10-27 DIAGNOSIS — F909 Attention-deficit hyperactivity disorder, unspecified type: Secondary | ICD-10-CM

## 2017-10-27 MED ORDER — AMPHETAMINE-DEXTROAMPHETAMINE 20 MG PO TABS: 20.0000 mg | ORAL_TABLET | Freq: Two times a day (BID) | ORAL | 0 refills | Status: AC

## 2018-03-27 ENCOUNTER — Telehealth: Payer: Self-pay | Admitting: *Deleted

## 2018-03-27 NOTE — Telephone Encounter (Signed)
Pt called requesting to know when her last colonoscopy was.  She has moved to [facility] and was updating her medical records.

## 2018-04-19 ENCOUNTER — Other Ambulatory Visit: Payer: Self-pay | Admitting: *Deleted

## 2018-04-19 NOTE — Telephone Encounter (Signed)
Opened in error. Kellyann Ordway Dawn, CMA  

## 2018-05-31 ENCOUNTER — Other Ambulatory Visit: Payer: Self-pay | Admitting: Family Medicine

## 2018-05-31 DIAGNOSIS — N803 Endometriosis of pelvic peritoneum, unspecified: Secondary | ICD-10-CM

## 2018-05-31 MED ORDER — NORETHINDRONE ACETATE 5 MG PO TABS: 5.0000 mg | ORAL_TABLET | Freq: Every day | ORAL | 2 refills | Status: DC | PRN

## 2018-05-31 MED ORDER — LIRAGLUTIDE -WEIGHT MANAGEMENT 18 MG/3ML ~~LOC~~ SOPN: 1.0000 | PEN_INJECTOR | Freq: Every day | SUBCUTANEOUS | 0 refills | Status: DC

## 2018-06-02 MED ORDER — LIRAGLUTIDE -WEIGHT MANAGEMENT 18 MG/3ML ~~LOC~~ SOPN: 1.0000 | PEN_INJECTOR | Freq: Every day | SUBCUTANEOUS | 0 refills | Status: AC

## 2018-06-02 NOTE — Addendum Note (Signed)
Addended by: Langston Reusing on: 06/02/2018 12:04 PM   Modules accepted: Orders

## 2018-10-06 ENCOUNTER — Other Ambulatory Visit: Payer: Self-pay | Admitting: *Deleted

## 2018-10-06 DIAGNOSIS — N803 Endometriosis of pelvic peritoneum, unspecified: Secondary | ICD-10-CM

## 2018-10-06 MED ORDER — NORETHINDRONE ACETATE 5 MG PO TABS: 5.0000 mg | ORAL_TABLET | Freq: Every day | ORAL | 2 refills | Status: AC | PRN

## 2020-02-21 ENCOUNTER — Other Ambulatory Visit: Payer: Self-pay | Admitting: Obstetrics & Gynecology

## 2020-02-21 DIAGNOSIS — N952 Postmenopausal atrophic vaginitis: Secondary | ICD-10-CM

## 2020-02-21 MED ORDER — ESTRADIOL 0.1 MG/GM VA CREA: TOPICAL_CREAM | VAGINAL | 12 refills | Status: AC

## 2020-02-21 MED ORDER — PREMARIN 0.625 MG/GM VA CREA: TOPICAL_CREAM | VAGINAL | 12 refills | Status: DC

## 2020-02-21 NOTE — Addendum Note (Signed)
Addended by: Jaynie Collins A on: 02/21/2020 01:06 PM   Modules accepted: Orders

## 2020-02-21 NOTE — Progress Notes (Signed)
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  Estrogen vaginal cream prescribed, she will follow up with new GYN provider in [place].  Jaynie Collins, MD

## 2021-10-15 ENCOUNTER — Other Ambulatory Visit: Payer: Self-pay | Admitting: Student

## 2021-10-15 DIAGNOSIS — M461 Sacroiliitis, not elsewhere classified: Secondary | ICD-10-CM

## 2021-10-23 ENCOUNTER — Ambulatory Visit
Admission: RE | Admit: 2021-10-23 | Discharge: 2021-10-23 | Disposition: A | Discharge status: Home / Self Care | Payer: BC Managed Care – PPO | Source: Ambulatory Visit | Attending: Student | Admitting: Student

## 2021-10-23 DIAGNOSIS — M461 Sacroiliitis, not elsewhere classified: Secondary | ICD-10-CM

## 2021-10-23 MED ORDER — IOPAMIDOL (ISOVUE-M 200) INJECTION 41%
1.0000 mL | Freq: Once | INTRAMUSCULAR | Status: AC
  Administered 2021-10-23: 1 mL via INTRA_ARTICULAR

## 2021-10-23 MED ORDER — METHYLPREDNISOLONE ACETATE 40 MG/ML INJ SUSP (RADIOLOG
80.0000 mg | Freq: Once | INTRAMUSCULAR | Status: AC
  Administered 2021-10-23: 80 mg via INTRA_ARTICULAR

## 2021-10-23 NOTE — Discharge Instructions (Signed)
